# Patient Record
Sex: Female | Born: 2000 | Hispanic: Yes | Marital: Single | Smoking: Never smoker
Health system: Southern US, Community
[De-identification: ages and names within clinical notes are randomized; demographics above are authoritative.]

## PROBLEM LIST (undated history)

## (undated) DIAGNOSIS — F99 Mental disorder, not otherwise specified: Secondary | ICD-10-CM

## (undated) HISTORY — DX: Mental disorder, not otherwise specified: F99

---

## 2019-05-27 ENCOUNTER — Ambulatory Visit: Payer: Self-pay | Attending: Internal Medicine

## 2019-05-27 DIAGNOSIS — Z23 Encounter for immunization: Secondary | ICD-10-CM

## 2019-05-27 NOTE — Progress Notes (Signed)
   Covid-19 Vaccination Clinic  Name:  Gina Munoz    MRN: [id number] DOB: 12/20/2000  05/27/2019  Ms. Gina Munoz was observed post Covid-19 immunization for 15 minutes without incident. She was provided with Vaccine Information Sheet and instruction to access the V-Safe system. Medical Interpreter used.  Ms. Gina Munoz was instructed to call 911 with any severe reactions post vaccine: Marland Kitchen Difficulty breathing  . Swelling of face and throat  . A fast heartbeat  . A bad rash all over body  . Dizziness and weakness   Immunizations Administered    Name Date Dose VIS Date Route   Pfizer COVID-19 Vaccine 05/27/2019  4:29 PM 0.3 mL 02/04/2019 Intramuscular   Manufacturer: [employer], [employer]   Lot: [id number]   NDC: [address]-1

## 2019-06-17 ENCOUNTER — Ambulatory Visit: Payer: Self-pay | Attending: Internal Medicine

## 2019-06-17 DIAGNOSIS — Z23 Encounter for immunization: Secondary | ICD-10-CM

## 2019-06-17 NOTE — Progress Notes (Signed)
   Covid-19 Vaccination Clinic  Name:  Nakiah Osgood    MRN: [id number] DOB: 02/09/2001  06/17/2019  Ms. Everlynn Sagun was observed post Covid-19 immunization for 15 minutes without incident. She was provided with Vaccine Information Sheet and instruction to access the V-Safe system.   Ms. Bryella Diviney was instructed to call 911 with any severe reactions post vaccine: Marland Kitchen Difficulty breathing  . Swelling of face and throat  . A fast heartbeat  . A bad rash all over body  . Dizziness and weakness   Immunizations Administered    Name Date Dose VIS Date Route   Pfizer COVID-19 Vaccine 06/17/2019  3:54 PM 0.3 mL 04/20/2018 Intramuscular   Manufacturer: [employer], [employer]   Lot: [id number]   NDC: [address]-7

## 2020-10-13 ENCOUNTER — Encounter: Payer: Self-pay | Admitting: Emergency Medicine

## 2020-10-13 ENCOUNTER — Inpatient Hospital Stay
Admission: RE | Admit: 2020-10-13 | Discharge: 2020-10-19 | DRG: 885 | Disposition: A | Discharge status: Home / Self Care | Payer: 59 | Source: Intra-hospital | Attending: Psychiatry | Admitting: Psychiatry

## 2020-10-13 ENCOUNTER — Other Ambulatory Visit: Payer: Self-pay

## 2020-10-13 ENCOUNTER — Emergency Department
Admission: EM | Admit: 2020-10-13 | Discharge: 2020-10-13 | Disposition: A | Discharge status: Home Health | Payer: Self-pay | Attending: Emergency Medicine | Admitting: Emergency Medicine

## 2020-10-13 ENCOUNTER — Encounter: Payer: Self-pay | Admitting: Psychiatry

## 2020-10-13 DIAGNOSIS — F060 Psychotic disorder with hallucinations due to known physiological condition: Secondary | ICD-10-CM | POA: Insufficient documentation

## 2020-10-13 DIAGNOSIS — G4700 Insomnia, unspecified: Secondary | ICD-10-CM | POA: Diagnosis present

## 2020-10-13 DIAGNOSIS — R443 Hallucinations, unspecified: Secondary | ICD-10-CM | POA: Diagnosis present

## 2020-10-13 DIAGNOSIS — F419 Anxiety disorder, unspecified: Secondary | ICD-10-CM | POA: Diagnosis present

## 2020-10-13 DIAGNOSIS — Z818 Family history of other mental and behavioral disorders: Secondary | ICD-10-CM

## 2020-10-13 DIAGNOSIS — Y900 Blood alcohol level of less than 20 mg/100 ml: Secondary | ICD-10-CM | POA: Insufficient documentation

## 2020-10-13 DIAGNOSIS — F29 Unspecified psychosis not due to a substance or known physiological condition: Secondary | ICD-10-CM | POA: Diagnosis present

## 2020-10-13 DIAGNOSIS — Z20822 Contact with and (suspected) exposure to covid-19: Secondary | ICD-10-CM | POA: Diagnosis present

## 2020-10-13 DIAGNOSIS — F32A Depression, unspecified: Secondary | ICD-10-CM | POA: Diagnosis present

## 2020-10-13 DIAGNOSIS — F23 Brief psychotic disorder: Principal | ICD-10-CM | POA: Diagnosis present

## 2020-10-13 DIAGNOSIS — N946 Dysmenorrhea, unspecified: Secondary | ICD-10-CM | POA: Diagnosis not present

## 2020-10-13 LAB — CBC
HCT: 38 % (ref 36.0–46.0)
Hemoglobin: 13 g/dL (ref 12.0–15.0)
MCH: 29 pg (ref 26.0–34.0)
MCHC: 34.2 g/dL (ref 30.0–36.0)
MCV: 84.8 fL (ref 80.0–100.0)
Platelets: 301 10*3/uL (ref 150–400)
RBC: 4.48 MIL/uL (ref 3.87–5.11)
RDW: 12.2 % (ref 11.5–15.5)
WBC: 9.6 10*3/uL (ref 4.0–10.5)
nRBC: 0 % (ref 0.0–0.2)

## 2020-10-13 LAB — COMPREHENSIVE METABOLIC PANEL
ALT: 16 U/L (ref 0–44)
AST: 16 U/L (ref 15–41)
Albumin: 4.5 g/dL (ref 3.5–5.0)
Alkaline Phosphatase: 64 U/L (ref 38–126)
Anion gap: 7 (ref 5–15)
BUN: 10 mg/dL (ref 6–20)
CO2: 23 mmol/L (ref 22–32)
Calcium: 8.8 mg/dL — ABNORMAL LOW (ref 8.9–10.3)
Chloride: 105 mmol/L (ref 98–111)
Creatinine, Ser: 0.59 mg/dL (ref 0.44–1.00)
GFR, Estimated: >60 mL/min (ref >60)
Glucose, Bld: 109 mg/dL — ABNORMAL HIGH (ref 70–99)
Potassium: 3.8 mmol/L (ref 3.5–5.1)
Sodium: 135 mmol/L (ref 135–145)
Total Bilirubin: 0.8 mg/dL (ref 0.3–1.2)
Total Protein: 7.8 g/dL (ref 6.5–8.1)

## 2020-10-13 LAB — RESP PANEL BY RT-PCR (FLU A&B, COVID) ARPGX2
Influenza A by PCR: NEGATIVE
Influenza B by PCR: NEGATIVE
SARS Coronavirus 2 by RT PCR: NEGATIVE

## 2020-10-13 LAB — SALICYLATE LEVEL: Salicylate Lvl: <7 mg/dL — ABNORMAL LOW (ref 7.0–30.0)

## 2020-10-13 LAB — ETHANOL: Alcohol, Ethyl (B): <10 mg/dL (ref <10)

## 2020-10-13 LAB — ACETAMINOPHEN LEVEL: Acetaminophen (Tylenol), Serum: <10 ug/mL — ABNORMAL LOW (ref 10–30)

## 2020-10-13 MED ORDER — RISPERIDONE 1 MG PO TABS
0.5000 mg | ORAL_TABLET | Freq: Two times a day (BID) | ORAL | Status: DC
  Administered 2020-10-13: 0.5 mg via ORAL
  Filled 2020-10-13: qty 1

## 2020-10-13 MED ORDER — TRAZODONE HCL 50 MG PO TABS: 25.0000 mg | ORAL_TABLET | Freq: Every evening | ORAL | Status: DC | PRN

## 2020-10-13 MED ORDER — LORAZEPAM 1 MG PO TABS
1.0000 mg | ORAL_TABLET | Freq: Once | ORAL | Status: AC
  Administered 2020-10-13: 1 mg via ORAL
  Filled 2020-10-13: qty 1

## 2020-10-13 MED ORDER — ALUM & MAG HYDROXIDE-SIMETH 200-200-20 MG/5ML PO SUSP
30.0000 mL | ORAL | Status: DC | PRN
Start: 2020-10-13 — End: 2020-10-19

## 2020-10-13 MED ORDER — MAGNESIUM HYDROXIDE 400 MG/5ML PO SUSP
30.0000 mL | Freq: Every day | ORAL | Status: DC | PRN
Start: 2020-10-13 — End: 2020-10-19

## 2020-10-13 MED ORDER — ACETAMINOPHEN 325 MG PO TABS
650.0000 mg | ORAL_TABLET | Freq: Four times a day (QID) | ORAL | Status: DC | PRN
  Administered 2020-10-15 – 2020-10-16 (×2): 650 mg via ORAL
  Filled 2020-10-13 (×2): qty 2

## 2020-10-13 MED ORDER — TRAZODONE HCL 50 MG PO TABS
25.0000 mg | ORAL_TABLET | Freq: Every evening | ORAL | Status: DC | PRN
  Administered 2020-10-13: 25 mg via ORAL
  Filled 2020-10-13: qty 1

## 2020-10-13 MED ORDER — RISPERIDONE 1 MG PO TABS
0.5000 mg | ORAL_TABLET | Freq: Two times a day (BID) | ORAL | Status: DC
  Administered 2020-10-13 – 2020-10-15 (×4): 0.5 mg via ORAL
  Filled 2020-10-13 (×4): qty 1

## 2020-10-13 NOTE — ED Notes (Signed)
PATIENT PRESENTS WITH THE FOLLOWING ITEMS  JEWELRY SENT HOME WITH BF  1 PINK AND GREY JACKET 1 PAIR OF BLACK NIKE SHOES 1 PAIR OF GREY SOCKS BLACK LEGGINGS BLACK T-SHIRT

## 2020-10-13 NOTE — ED Notes (Signed)
Father at bedside for visit. Supervised by security.

## 2020-10-13 NOTE — ED Notes (Signed)
All patient clothing and shoes placed in belongings bag, labeled, and placed at the nursing station to be locked up in BHU. Patient provided burgundy scrubs, mesh underwear, and non-skid socks.

## 2020-10-13 NOTE — Tx Team (Signed)
Initial Treatment Plan 10/13/2020 4:34 PM [name] KDT:[id number]   PATIENT STRESSORS: Educational concerns Occupational concerns   PATIENT STRENGTHS: Physical Health Supportive family/friends   PATIENT IDENTIFIED PROBLEMS: Pressure from school and work                     DISCHARGE CRITERIA:  Ability to meet basic life and health needs Improved stabilization in mood, thinking, and/or behavior Medical problems require only outpatient monitoring  PRELIMINARY DISCHARGE PLAN: Return to previous living arrangement Return to previous work or school arrangements  PATIENT/FAMILY INVOLVEMENT: This treatment plan has been presented to and reviewed with the patient, Gina Munoz.  The patient has been given the opportunity to ask questions and make suggestions.  Celene Kras, RN 10/13/2020, 4:34 PM

## 2020-10-13 NOTE — Progress Notes (Signed)
Patient is very anxious on assessment. She appears internally preoccupied and is easily distractible. Patient is cautious and continuously repeats that she just wants to go home throughout the assessment. Patient denies suicidal and homicidal ideations. When asked about auditory and visual hallucinations, patient states, "not anymore". She is very vague about describing this and turns back on her words and states that she didn't really hear voices, she was just not sleeping for a few days due to drinking energy drinks throughout the day. She feels the sleep is her only issue and states that she does not need to be here. 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 She repeats that she is stressed and she is under a lot of pressure due to "a lot of things that have happened". Patient does not describe what has happened and states that there has not been any change in her routine for the past few weeks. Patient denies using any substances. Patient admission assessment needed to be cut short because patient was very tearful and preoccupied with speaking to her family. Patient was shown her room and oriented to the unit. Unit rules were explained. Dinner tray was ordered. Patient is seen talking on the phone with her father.   Skin assessment was done with Ellwood Handler, RN. No contraband found. Patient remains safe on the unit at this time.

## 2020-10-13 NOTE — ED Notes (Signed)
MD back at the bedside speaking with pt's father via interpreter service

## 2020-10-13 NOTE — ED Notes (Signed)
Father remains at the bedside

## 2020-10-13 NOTE — ED Provider Notes (Signed)
[facility] Emergency Department Provider Note  ____________________________________________   Event Date/Time   First MD Initiated Contact with Patient 10/13/20 [phone]     (approximate)  I have reviewed the triage vital signs and the nursing notes.   HISTORY  Chief Complaint Psychiatric Evaluation    HPI Gina Munoz is a 20 y.o. female with no significant past medical history who presents to the emergency department with her father and boyfriend for concerns for her not sleeping for the past 3 days.  Boyfriend is currently at the bedside he states that her father is currently in the parking lot.  He states that the patient lives with her parents.  The patient tells me that she has not been sleeping because she hears "a whole lot of noises around".  She tells me that she is hearing cars.  When asking her if she is hearing voices she tells me yes.  She is unable to tell me what these voices are saying but denies that they are telling her to harm herself or anyone else.  She is very difficult to get a history from as she appears to be responding to internal stimuli.  She denies psychiatric history.  Denies psychiatric hospitalization.  Denies taking any medications regularly.  Denies drug or alcohol use.  Denies pain, fever, cough, vomiting, diarrhea.  No family history of psychiatric illness.    Father - [phone])      History reviewed. No pertinent past medical history.  There are no problems to display for this patient.   History reviewed. No pertinent surgical history.  Prior to Admission medications   Not on File    Allergies Patient has no allergy information on record.  No family history on file.  Social History Social History   Tobacco Use   Smoking status: Never    Passive exposure: Never   Smokeless tobacco: Never  Vaping Use   Vaping Use: Every day  Substance Use Topics   Alcohol use: Not Currently   Drug use: Not  Currently    Review of Systems Level 5 caveat secondary to psychosis  ____________________________________________   PHYSICAL EXAM:  VITAL SIGNS: ED Triage Vitals  Enc Vitals Group     BP 10/13/20 0349 (!) 141/92     Pulse Rate 10/13/20 0349 87     Resp 10/13/20 0349 20     Temp 10/13/20 0349 98.9 F (37.2 C)     Temp Source 10/13/20 0349 Oral     SpO2 10/13/20 0349 100 %     Weight 10/13/20 0354 115 lb (52.2 kg)     Height 10/13/20 0354 5' (1.524 m)     Head Circumference --      Peak Flow --      Pain Score 10/13/20 0353 0     Pain Loc --      Pain Edu? --      Excl. in GC? --    CONSTITUTIONAL: Alert and oriented and responds appropriately to questions.  Appears very anxious HEAD: Normocephalic EYES: Conjunctivae clear, pupils appear equal, EOM appear intact ENT: normal nose; moist mucous membranes NECK: Supple, normal ROM CARD: RRR; S1 and S2 appreciated; no murmurs, no clicks, no rubs, no gallops RESP: Normal chest excursion without splinting or tachypnea; breath sounds clear and equal bilaterally; no wheezes, no rhonchi, no rales, no hypoxia or respiratory distress, speaking full sentences ABD/GI: Normal bowel sounds; non-distended; soft, non-tender, no rebound, no guarding, no peritoneal signs, no  hepatosplenomegaly BACK: The back appears normal EXT: Normal ROM in all joints; no deformity noted, no edema; no cyanosis SKIN: Normal color for age and race; warm; no rash on exposed skin NEURO: Moves all extremities equally PSYCH: Appears anxious and appears to be responding to internal stimuli.  Slow to answer questions.  She denies SI, HI.  Denies command hallucinations.  ____________________________________________   LABS (all labs ordered are listed, but only abnormal results are displayed)  Labs Reviewed  RESP PANEL BY RT-PCR (FLU A&B, COVID) [id number]  COMPREHENSIVE METABOLIC PANEL  ETHANOL  CBC  URINE DRUG SCREEN, QUALITATIVE ([facility] ONLY)  ACETAMINOPHEN  LEVEL  SALICYLATE LEVEL  POC URINE PREG, ED   ____________________________________________  EKG   ____________________________________________  RADIOLOGY I, [name], personally viewed and evaluated these images (plain radiographs) as part of my medical decision making, as well as reviewing the written report by the radiologist.  ED MD interpretation:    Official radiology report(s): No results found.  ____________________________________________   PROCEDURES  Procedure(s) performed (including Critical Care):  Procedures  ____________________________________________   INITIAL IMPRESSION / ASSESSMENT AND PLAN / ED COURSE  As part of my medical decision making, I reviewed the following data within the electronic MEDICAL RECORD NUMBER History obtained from family, Nursing notes reviewed and incorporated, Interpreter needed, Labs reviewed , Old chart reviewed, A consult was requested and obtained from this/these consultant(s) Psychiatry, Notes from prior ED visits, and [place] Controlled Substance Database         Patient here for concerns for psychosis and potentially her first psychotic break.  Differential also includes intoxication, psychosis from drug use.  She appears to be responding to internal stimuli and reports that she has been hearing voices but is not able to tell me what they are saying to her.  She denies any SI or HI.  She appears very anxious here and boyfriend reports she has not been sleeping in 3 days.  We will obtain screening labs, urine.  We will get collateral information from her father who speaks mostly Spanish.  We will place her under full IVC at this time and consult TTS and psychiatry.  ED PROGRESS  Father now at bedside.  Using the Spanish interpreter he reports that she has not slept in 3 days and has been acting bizarrely and has had nonsensical speech.  He states that she for example has told them that she was falling repeatedly when she was  standing and not falling down.  He states she has been talking about angels.  He is not aware of her voicing any thoughts of SI, HI or hallucinations.  She admits again to auditory hallucinations but states she cannot tell me what they are saying.  Have discussed with father that I feel she needs psychiatric evaluation and he agrees.  We will give Ativan here to help with insomnia.  7:43 AM  Patient screening labs are unremarkable.  Drug screen pending.  Awaiting psychiatric evaluation for further disposition.  She is under full IVC at this time.   I reviewed all nursing notes and pertinent previous records as available.  I have reviewed and interpreted any EKGs, lab and urine results, imaging (as available).  ____________________________________________   FINAL CLINICAL IMPRESSION(S) / ED DIAGNOSES  Final diagnoses:  Hallucinations  Insomnia, unspecified type     ED Discharge Orders     None       *Please note:  Navia Lindahl was evaluated in Emergency Department on 10/13/2020 for  the symptoms described in the history of present illness. She was evaluated in the context of the global COVID-19 pandemic, which necessitated consideration that the patient might be at risk for infection with the SARS-CoV-2 virus that causes COVID-19. Institutional protocols and algorithms that pertain to the evaluation of patients at risk for COVID-19 are in a state of rapid change based on information released by regulatory bodies including the CDC and federal and state organizations. These policies and algorithms were followed during the patient's care in the ED.  Some ED evaluations and interventions may be delayed as a result of limited staffing during and the pandemic.*   Note:  This document was prepared using Dragon voice recognition software and may include unintentional dictation errors.    Odell Choung, Layla Maw, DO 10/13/20 [phone]

## 2020-10-13 NOTE — ED Notes (Signed)
IVC/pending psych consult 

## 2020-10-13 NOTE — ED Notes (Signed)
MD at the bedside  

## 2020-10-13 NOTE — ED Notes (Signed)
Pt father at bedside at this time. 

## 2020-10-13 NOTE — ED Notes (Signed)
Upon this RN coming onto shift father was at bedside. This RN handed father a print out of visitation rules and phone call hours. Explained to father policy about visiting/talking to pt on the phone. Answered fathers questions and made sure his phone number is on file. Escorted father to lobby. Pt sleeping.

## 2020-10-13 NOTE — Progress Notes (Signed)
Patient more social interacting in the dayroom with others.  She has complaint of not being able to sleep.  She denies si/hi/avh depression.  She does endorse anxiety and rates her level 8/10. She keeps repeating  the same phrase over and over, that "a lot has happened" but she is never able to elaborate any further. Will continue to encourage confinement as she appears to have the need to discuss event(s) that has happened in her life.  Will continue to monitor with Q 15 minute checks.  Cleo Butler-Nicholson, LPN

## 2020-10-13 NOTE — ED Notes (Signed)
Pysch NP and TTS at bedside

## 2020-10-13 NOTE — Consult Note (Signed)
[facility] Face-to-Face Psychiatry Consult   Reason for Consult:  psychosis  Referring Physician:  EDP Patient Identification: Gina Munoz MRN:  [id number] Principal Diagnosis: Psychotic disorder with hallucinations (HCC) Diagnosis:  Principal Problem:   Psychotic disorder with hallucinations (HCC)   Total Time spent with patient: 1 hour  Subjective:   Gina Munoz is a 20 y.o. female patient admitted with psychosis.  HPI:  20 yo female with psychosis.  It started when her family returned from the beach, a couple weeks ago, and she was more distracted and repeating herself.  These behaviors increased to the point where she has not slept in three days.  She told her family she was hearing things.  On assessment, she is clearly responding to internal stimuli with some paucity of speech, very scared.  The team spoke to her father with an interpreter and is concerned about her behaviors.  Her brother in [place] had a similar episode awhile back.  No substance abuse, she does drink two energy drinks per day.  No recent or past traumas.  Denies suicidal/homicidal ideations.  She will transfer to [facility] for stabilization.  Past Psychiatric History: none  Risk to Self:  yes Risk to Others:  none Prior Inpatient Therapy:  none Prior Outpatient Therapy:  none  Past Medical History: History reviewed. No pertinent past medical history. History reviewed. No pertinent surgical history. Family History: No family history on file. Family Psychiatric  History: brother with similar episode in [place] Social History:  Social History   Substance and Sexual Activity  Alcohol Use Not Currently     Social History   Substance and Sexual Activity  Drug Use Not Currently    Social History   Socioeconomic History   Marital status: Single    Spouse name: Not on file   Number of children: Not on file   Years of education: Not on file   Highest education level: Not on file  Occupational  History   Not on file  Tobacco Use   Smoking status: Never    Passive exposure: Never   Smokeless tobacco: Never  Vaping Use   Vaping Use: Every day  Substance and Sexual Activity   Alcohol use: Not Currently   Drug use: Not Currently   Sexual activity: Yes  Other Topics Concern   Not on file  Social History Narrative   Not on file   Social Determinants of Health   Financial Resource Strain: Not on file  Food Insecurity: Not on file  Transportation Needs: Not on file  Physical Activity: Not on file  Stress: Not on file  Social Connections: Not on file   Additional Social History:    Allergies:  Not on File  Labs:  Results for orders placed or performed during the hospital encounter of 10/13/20 (from the past 48 hour(s))  Comprehensive metabolic panel     Status: Abnormal   Collection Time: 10/13/20  4:16 AM  Result Value Ref Range   Sodium 135 135 - 145 mmol/L   Potassium 3.8 3.5 - 5.1 mmol/L   Chloride 105 98 - 111 mmol/L   CO2 23 22 - 32 mmol/L   Glucose, Bld 109 (H) 70 - 99 mg/dL    Comment: Glucose reference range applies only to samples taken after fasting for at least 8 hours.   BUN 10 6 - 20 mg/dL   Creatinine, Ser 0.07 0.44 - 1.00 mg/dL   Calcium 8.8 (L) 8.9 - 10.3 mg/dL  Total Protein 7.8 6.5 - 8.1 g/dL   Albumin 4.5 3.5 - 5.0 g/dL   AST 16 15 - 41 U/L   ALT 16 0 - 44 U/L   Alkaline Phosphatase 64 38 - 126 U/L   Total Bilirubin 0.8 0.3 - 1.2 mg/dL   GFR, Estimated >16>60 >10>60 mL/min    Comment: (NOTE) Calculated using the CKD-EPI Creatinine Equation (2021)    Anion gap 7 5 - 15    Comment: Performed at [facility], [address] [place], [place] [address]  Ethanol     Status: None   Collection Time: 10/13/20  4:16 AM  Result Value Ref Range   Alcohol, Ethyl (B) <10 <10 mg/dL    Comment: (NOTE) Lowest detectable limit for serum alcohol is 10 mg/dL.  For medical purposes only. Performed at [facility], [address]  [place], [place] [address]   cbc     Status: None   Collection Time: 10/13/20  4:16 AM  Result Value Ref Range   WBC 9.6 4.0 - 10.5 K/uL   RBC 4.48 3.87 - 5.11 MIL/uL   Hemoglobin 13.0 12.0 - 15.0 g/dL   HCT 19.138.0 47.836.0 - 29.546.0 %   MCV 84.8 80.0 - 100.0 fL   MCH 29.0 26.0 - 34.0 pg   MCHC 34.2 30.0 - 36.0 g/dL   RDW 62.112.2 30.811.5 - 65.715.5 %   Platelets 301 150 - 400 K/uL   nRBC 0.0 0.0 - 0.2 %    Comment: Performed at [facility], [address] [place], [place] [address]  Acetaminophen level     Status: Abnormal   Collection Time: 10/13/20  5:01 AM  Result Value Ref Range   Acetaminophen (Tylenol), Serum <10 (L) 10 - 30 ug/mL    Comment: (NOTE) Therapeutic concentrations vary significantly. A range of 10-30 ug/mL  may be an effective concentration for many patients. However, some  are best treated at concentrations outside of this range. Acetaminophen concentrations >150 ug/mL at 4 hours after ingestion  and >50 ug/mL at 12 hours after ingestion are often associated with  toxic reactions.  Performed at [facility], [address] [place], [place] [address]   Salicylate level     Status: Abnormal   Collection Time: 10/13/20  5:01 AM  Result Value Ref Range   Salicylate Lvl <7.0 (L) 7.0 - 30.0 mg/dL    Comment: Performed at [facility], [address] [place], [place] [address]  Resp Panel by RT-PCR (Flu A&B, Covid) Nasopharyngeal Swab     Status: None   Collection Time: 10/13/20  5:01 AM   Specimen: Nasopharyngeal Swab; Nasopharyngeal(NP) swabs in vial transport medium  Result Value Ref Range   SARS Coronavirus 2 by RT PCR NEGATIVE NEGATIVE    Comment: (NOTE) SARS-CoV-2 target nucleic acids are NOT DETECTED.  The SARS-CoV-2 RNA is generally detectable in upper respiratory specimens during the acute phase of infection. The lowest concentration of SARS-CoV-2 viral copies this assay can detect is 138 copies/mL. A negative result does not preclude  SARS-Cov-2 infection and should not be used as the sole basis for treatment or other patient management decisions. A negative result may occur with  improper specimen collection/handling, submission of specimen other than nasopharyngeal swab, presence of viral mutation(s) within the areas targeted by this assay, and inadequate number of viral copies(<138 copies/mL). A negative result must be combined with clinical observations, patient history, and epidemiological information. The expected result is Negative.  Fact Sheet for Patients:  BloggerCourse.com  Fact Sheet for Healthcare Providers:  SeriousBroker.it  This test is no t yet approved or cleared by the [place] FDA and  has been authorized for detection and/or diagnosis of SARS-CoV-2 by FDA under an Emergency Use Authorization (EUA). This EUA will remain  in effect (meaning this test can be used) for the duration of the COVID-19 declaration under Section 564(b)(1) of the Act, 21 U.S.C.section 360bbb-3(b)(1), unless the authorization is terminated  or revoked sooner.       Influenza A by PCR NEGATIVE NEGATIVE   Influenza B by PCR NEGATIVE NEGATIVE    Comment: (NOTE) The Xpert Xpress SARS-CoV-2/FLU/RSV plus assay is intended as an aid in the diagnosis of influenza from Nasopharyngeal swab specimens and should not be used as a sole basis for treatment. Nasal washings and aspirates are unacceptable for Xpert Xpress SARS-CoV-2/FLU/RSV testing.  Fact Sheet for Patients: BloggerCourse.com  Fact Sheet for Healthcare Providers: SeriousBroker.it  This test is not yet approved or cleared by the [place] FDA and has been authorized for detection and/or diagnosis of SARS-CoV-2 by FDA under an Emergency Use Authorization (EUA). This EUA will remain in effect (meaning this test can be used) for the duration of the COVID-19  declaration under Section 564(b)(1) of the Act, 21 U.S.C. section 360bbb-3(b)(1), unless the authorization is terminated or revoked.  Performed at [facility], [address] [place], [place] [address]     Current Facility-Administered Medications  Medication Dose Route Frequency Provider Last Rate Last Admin   risperiDONE (RISPERDAL) tablet 0.5 mg  0.5 mg Oral BID Charm Rings, NP       No current outpatient medications on file.    Musculoskeletal: Strength & Muscle Tone: within normal limits Gait & Station: normal Patient leans: N/A  Psychiatric Specialty Exam: Physical Exam Vitals and nursing note reviewed.  Constitutional:      Appearance: Normal appearance.  HENT:     Head: Normocephalic.     Nose: Nose normal.  Pulmonary:     Effort: Pulmonary effort is normal.  Musculoskeletal:        General: Normal range of motion.     Cervical back: Normal range of motion.  Neurological:     General: No focal deficit present.     Mental Status: She is alert and oriented to person, place, and time.  Psychiatric:        Attention and Perception: She is inattentive. She perceives auditory hallucinations.        Mood and Affect: Mood is anxious.        Speech: Speech normal.        Behavior: Behavior normal. Behavior is cooperative.        Thought Content: Thought content is paranoid.        Cognition and Memory: Cognition is impaired.        Judgment: Judgment normal.    Review of Systems  Psychiatric/Behavioral:  Positive for hallucinations. The patient is nervous/anxious.   All other systems reviewed and are negative.  Blood pressure (!) 94/56, pulse 73, temperature 98.9 F (37.2 C), temperature source Oral, resp. rate 15, height 5' (1.524 m), weight 52.2 kg, SpO2 100 %.Body mass index is 22.46 kg/m.  General Appearance: Casual  Eye Contact:  Fair  Speech:  Normal Rate with some paucity  Volume:  Decreased  Mood:  Anxious  Affect:  Congruent  Thought  Process:  Coherent and Descriptions of Associations: Intact  Orientation:  Full (Time, Place, and Person)  Thought Content:  Hallucinations: Auditory  Suicidal Thoughts:  No  Homicidal Thoughts:  No  Memory:  Immediate;   Fair Recent;   Fair Remote;   Fair  Judgement:  Fair  Insight:  Fair  Psychomotor Activity:  Decreased  Concentration:  Concentration: Fair and Attention Span: Fair  Recall:  Fair  Fund of Knowledge:  Good  Language:  Good  Akathisia:  No  Handed:  Right  AIMS (if indicated):     Assets:  Housing Leisure Time Physical Health Resilience Social Support  ADL's:  Intact  Cognition:  WNL  Sleep:        Physical Exam: Physical Exam Vitals and nursing note reviewed.  Constitutional:      Appearance: Normal appearance.  HENT:     Head: Normocephalic.     Nose: Nose normal.  Pulmonary:     Effort: Pulmonary effort is normal.  Musculoskeletal:        General: Normal range of motion.     Cervical back: Normal range of motion.  Neurological:     General: No focal deficit present.     Mental Status: She is alert and oriented to person, place, and time.  Psychiatric:        Attention and Perception: She is inattentive. She perceives auditory hallucinations.        Mood and Affect: Mood is anxious.        Speech: Speech normal.        Behavior: Behavior normal. Behavior is cooperative.        Thought Content: Thought content is paranoid.        Cognition and Memory: Cognition is impaired.        Judgment: Judgment normal.   Review of Systems  Psychiatric/Behavioral:  Positive for hallucinations. The patient is nervous/anxious.   All other systems reviewed and are negative. Blood pressure (!) 94/56, pulse 73, temperature 98.9 F (37.2 C), temperature source Oral, resp. rate 15, height 5' (1.524 m), weight 52.2 kg, SpO2 100 %. Body mass index is 22.46 kg/m.  Treatment Plan Summary: Daily contact with patient to assess and evaluate symptoms and progress in  treatment, Medication management, and Plan : Psychotic disorder with hallucinations: -Started Risperdal 0.5 mg BId -Admit to inpatient psychiatric hospital for stabilization  Insomnia: -Started Trazodone 25 mg PRN, repeat once in an hour if not asleep  Disposition: Recommend psychiatric Inpatient admission when medically cleared.  Nanine Means, NP 10/13/2020 11:45 AM

## 2020-10-13 NOTE — ED Notes (Signed)
Pt informed of transport to BMU. Appears anxious.

## 2020-10-13 NOTE — Plan of Care (Signed)
  Problem: Activity: Goal: Interest or engagement in activities will improve Outcome: Not Progressing Goal: Sleeping patterns will improve Outcome: Not Progressing   Problem: Activity: Goal: Interest or engagement in activities will improve Outcome: Not Progressing Goal: Sleeping patterns will improve Outcome: Not Progressing   Problem: Coping: Goal: Ability to verbalize frustrations and anger appropriately will improve Outcome: Not Progressing Goal: Ability to demonstrate self-control will improve Outcome: Not Progressing   Problem: Health Behavior/Discharge Planning: Goal: Identification of resources available to assist in meeting health care needs will improve Outcome: Not Progressing Goal: Compliance with treatment plan for underlying cause of condition will improve Outcome: Not Progressing   Problem: Physical Regulation: Goal: Ability to maintain clinical measurements within normal limits will improve Outcome: Not Progressing   Problem: Safety: Goal: Periods of time without injury will increase Outcome: Not Progressing   Problem: Activity: Goal: Will verbalize the importance of balancing activity with adequate rest periods Outcome: Not Progressing   Problem: Education: Goal: Will be free of psychotic symptoms Outcome: Not Progressing Goal: Knowledge of the prescribed therapeutic regimen will improve Outcome: Not Progressing   Problem: Coping: Goal: Coping ability will improve Outcome: Not Progressing Goal: Will verbalize feelings Outcome: Not Progressing   Problem: Health Behavior/Discharge Planning: Goal: Compliance with prescribed medication regimen will improve Outcome: Not Progressing   Problem: Nutritional: Goal: Ability to achieve adequate nutritional intake will improve Outcome: Not Progressing   Problem: Role Relationship: Goal: Ability to communicate needs accurately will improve Outcome: Not Progressing Goal: Ability to interact with others  will improve Outcome: Not Progressing

## 2020-10-13 NOTE — ED Triage Notes (Signed)
Pt presents to ER from home accompanied by father and boyfriend, pt very anxious. Reports hears a lot of noise and voices, reports voices are not telling her to do anything, the voices are just there, reports she has not been able to sleep for the past 2 days, not eating. "I just want to have a family, I want to get married(points at her boyfriend)" Pt is calm in triage, pt is accompanied by her boyfriend. Pt denies SI/HI or visual hallucinations

## 2020-10-13 NOTE — BH Assessment (Signed)
Comprehensive Clinical Assessment (CCA) Note  10/13/2020 Gina Munoz [id number]   Gina Munoz Dionne Ano is a 20 year old female who presents to the ER due to recent changes in her behaviors, mood and mental state. Per the father, approximately two weeks ago, she started having trouble sleeping and "scared." It has increased and now she hasn't slept in two days. Prior to this, she wasn't having any of these symptoms. Per the report of the patient's father, the changes occurred when they were on vacation at the beach and have progressively worsened.  He also shared, her brother had the similar symptoms in the past and is now taking medications for it. He has no knowledge of her having any trauma or recent stressors or changes in her daily routines.She admits to hearing voices but when asked for details she stated she doesn't hear them anymore and changed the subject.  During the interview, the patient was calm, cooperative and pleasant. She attempted to answer questions but was fixated on seeing and speaking with her family.   Chief Complaint:  Chief Complaint  Patient presents with   Psychiatric Evaluation   Visit Diagnosis: Psychosis Disorder with hallucinations  CCA Screening, Triage and Referral (STR)  Patient Reported Information How did you hear about [place]? Family/Friend  What Is the Reason for Your Visit/Call Today? Family noticed a change in her behaviors approximately two weeks ago and it has worsened.  How Long Has This Been Causing You Problems? 1 wk - 1 month  What Do You Feel Would Help You the Most Today? Treatment for Depression or other mood problem   Have You Recently Had Any Thoughts About Hurting Yourself? No  Are You Planning to Commit Suicide/Harm Yourself At This time? No   Have you Recently Had Thoughts About Hurting Someone Karolee Ohs? No  Are You Planning to Harm Someone at This Time? No  Explanation: No data recorded  Have You Used Any Alcohol or Drugs  in the Past 24 Hours? No  How Long Ago Did You Use Drugs or Alcohol? No data recorded What Did You Use and How Much? No data recorded  Do You Currently Have a Therapist/Psychiatrist? No  Name of Therapist/Psychiatrist: No data recorded  Have You Been Recently Discharged From Any Office Practice or Programs? No  Explanation of Discharge From Practice/Program: No data recorded    CCA Screening Triage Referral Assessment Type of Contact: Face-to-Face  Telemedicine Service Delivery:   Is this Initial or Reassessment? No data recorded Date Telepsych consult ordered in CHL:  No data recorded Time Telepsych consult ordered in CHL:  No data recorded Location of Assessment: [facility] ED  Provider Location: [facility] [place] ED   Collateral Involvement: Father-Ricardo   Does Patient Have a Court Appointed Legal Guardian? No data recorded Name and Contact of Legal Guardian: No data recorded If Minor and Not Living with Parent(s), Who has Custody? No data recorded Is CPS involved or ever been involved? Never  Is APS involved or ever been involved? Never   Patient Determined To Be At Risk for Harm To Self or Others Based on Review of Patient Reported Information or Presenting Complaint? No  Method: No data recorded Availability of Means: No data recorded Intent: No data recorded Notification Required: No data recorded Additional Information for Danger to Others Potential: No data recorded Additional Comments for Danger to Others Potential: No data recorded Are There Guns or Other Weapons in Your Home? No data recorded Types of Guns/Weapons: No data recorded Are These Weapons  Safely Secured?                            No data recorded Who Could Verify You Are Able To Have These Secured: No data recorded Do You Have any Outstanding Charges, Pending Court Dates, Parole/Probation? No data recorded Contacted To Inform of Risk of Harm To Self or Others: No data recorded   Does Patient Present under  Involuntary Commitment? No  IVC Papers Initial File Date: No data recorded  [place] of Residence: [place]   Patient Currently Receiving the Following Services: Not Receiving Services   Determination of Need: Emergent (2 hours)   Options For Referral: Inpatient Hospitalization   CCA Biopsychosocial Patient Reported Schizophrenia/Schizoaffective Diagnosis in Past: No   Strengths: Family support, student and doing well, and able to communicate her needs.   Mental Health Symptoms Depression:   Difficulty Concentrating; Change in energy/activity   Duration of Depressive symptoms:  Duration of Depressive Symptoms: Greater than two weeks   Mania:   N/A   Anxiety:    Worrying; Restlessness; Difficulty concentrating   Psychosis:   Hallucinations   Duration of Psychotic symptoms:  Duration of Psychotic Symptoms: Less than six months   Trauma:   None   Obsessions:   None   Compulsions:   None   Inattention:   N/A   Hyperactivity/Impulsivity:   None   Oppositional/Defiant Behaviors:   None   Emotional Irregularity:   Frantic efforts to avoid abandonment   Other Mood/Personality Symptoms:  No data recorded   Mental Status Exam Appearance and self-care  Stature:   Small   Weight:   Average weight   Clothing:   Neat/clean; Age-appropriate   Grooming:   Normal   Cosmetic use:   None   Posture/gait:   Normal   Motor activity:   -- (Within normal range)   Sensorium  Attention:   Normal   Concentration:   Anxiety interferes; Scattered   Orientation:   Person; Place   Recall/memory:   Defective in Recent   Affect and Mood  Affect:   Anxious; Depressed   Mood:   Anxious   Relating  Eye contact:   Normal   Facial expression:   Anxious; Responsive; Sad   Attitude toward examiner:   Cooperative   Thought and Language  Speech flow:  Articulation error; Slow   Thought content:   Suspicious   Preoccupation:   Other  (Comment)   Hallucinations:   None   Organization:  No data recorded  [employer] of Knowledge:   Fair   Intelligence:   Average   Abstraction:   Functional   Judgement:   Impaired   Reality Testing:   Distorted   Insight:   Fair; Poor   Decision Making:   Paralyzed; Confused   Social Functioning  Social Maturity:   Isolates   Social Judgement:   Normal   Stress  Stressors:   Other (Comment)   Coping Ability:   Overwhelmed   Skill Deficits:   None   Supports:   Family; Friends/Service system     Religion: Religion/Spirituality Are You A Religious Person?: No  Leisure/Recreation: Leisure / Recreation Do You Have Hobbies?: No  Exercise/Diet: Exercise/Diet Do You Exercise?: No Have You Gained or Lost A Significant Amount of Weight in the Past Six Months?: No Do You Follow a Special Diet?: No Do You Have Any Trouble Sleeping?: Yes Explanation of Sleeping  Difficulties: Per family, she hasn't slept in two days. Prior to that, she hasn't slept well in two weeks   CCA Employment/Education Employment/Work Situation: Employment / Work Situation Employment Situation: Student Has Patient ever Been in Equities trader?: No  Education: Education Is Patient Currently Attending School?: Yes School Currently Attending: [employer] Last Grade Completed: 13 Did You Product manager?: Yes Did You Have An Individualized Education Program (IIEP): No Did You Have Any Difficulty At [employer]?: No Patient's Education Has Been Impacted by Current Illness: No   CCA Family/Childhood History Family and Relationship History: Family history Marital status: Long term relationship Does patient have children?: No  Childhood History:  Childhood History By whom was/is the patient raised?: Both parents Did patient suffer any verbal/emotional/physical/sexual abuse as a child?: No Did patient suffer from severe childhood neglect?: No Has  patient ever been sexually abused/assaulted/raped as an adolescent or adult?: No Was the patient ever a victim of a crime or a disaster?: No Witnessed domestic violence?: No Has patient been affected by domestic violence as an adult?: No  Child/Adolescent Assessment:     CCA Substance Use Alcohol/Drug Use: Alcohol / Drug Use Pain Medications: See PTA Prescriptions: See PTA Over the Counter: See PTA History of alcohol / drug use?: No history of alcohol / drug abuse Longest period of sobriety (when/how long): n/a  ASAM's:  Six Dimensions of Multidimensional Assessment  Dimension 1:  Acute Intoxication and/or Withdrawal Potential:      Dimension 2:  Biomedical Conditions and Complications:      Dimension 3:  Emotional, Behavioral, or Cognitive Conditions and Complications:     Dimension 4:  Readiness to Change:     Dimension 5:  Relapse, Continued use, or Continued Problem Potential:     Dimension 6:  Recovery/Living Environment:     ASAM Severity Score:    ASAM Recommended Level of Treatment:     Substance use Disorder (SUD)    Recommendations for Services/Supports/Treatments:    Discharge Disposition:    DSM5 Diagnoses: Patient Active Problem List   Diagnosis Date Noted   Psychotic disorder with hallucinations (HCC) 10/13/2020     Referrals to Alternative Service(s): Referred to Alternative Service(s):   Place:   Date:   Time:    Referred to Alternative Service(s):   Place:   Date:   Time:    Referred to Alternative Service(s):   Place:   Date:   Time:    Referred to Alternative Service(s):   Place:   Date:   Time:      Lilyan Gilford MS, LCAS, [facility], [facility] Therapeutic Triage Specialist 10/13/2020 1:09 PM

## 2020-10-13 NOTE — ED Notes (Signed)
Pt provided lunch tray.

## 2020-10-14 LAB — URINALYSIS, ROUTINE W REFLEX MICROSCOPIC
Bilirubin Urine: NEGATIVE
Glucose, UA: NEGATIVE mg/dL
Ketones, ur: NEGATIVE mg/dL
Leukocytes,Ua: NEGATIVE
Nitrite: NEGATIVE
Protein, ur: NEGATIVE mg/dL
Specific Gravity, Urine: 1.015 (ref 1.005–1.030)
pH: 7.5 (ref 5.0–8.0)

## 2020-10-14 LAB — PREGNANCY, URINE: Preg Test, Ur: NEGATIVE

## 2020-10-14 LAB — URINE DRUG SCREEN, QUALITATIVE (ARMC ONLY)
Amphetamines, Ur Screen: NOT DETECTED
Barbiturates, Ur Screen: NOT DETECTED
Benzodiazepine, Ur Scrn: NOT DETECTED
Cannabinoid 50 Ng, Ur ~~LOC~~: NOT DETECTED
Cocaine Metabolite,Ur ~~LOC~~: NOT DETECTED
MDMA (Ecstasy)Ur Screen: NOT DETECTED
Methadone Scn, Ur: NOT DETECTED
Opiate, Ur Screen: NOT DETECTED
Phencyclidine (PCP) Ur S: NOT DETECTED
Tricyclic, Ur Screen: NOT DETECTED

## 2020-10-14 MED ORDER — HYDROXYZINE HCL 50 MG PO TABS
50.0000 mg | ORAL_TABLET | Freq: Four times a day (QID) | ORAL | Status: DC | PRN
  Administered 2020-10-14 – 2020-10-18 (×7): 50 mg via ORAL
  Filled 2020-10-14 (×7): qty 1

## 2020-10-14 MED ORDER — TRAZODONE HCL 50 MG PO TABS
50.0000 mg | ORAL_TABLET | Freq: Every day | ORAL | Status: DC
  Administered 2020-10-14 – 2020-10-18 (×5): 50 mg via ORAL
  Filled 2020-10-14 (×5): qty 1

## 2020-10-14 NOTE — [facility] Suicide Risk Assessment (Addendum)
[facility] INPATIENT:  Family/Significant Other Suicide Prevention Education  Suicide Prevention Education:  Education Completed; Jackie Plum, father [phone]), contacted using Spanish interpreter services, has been identified by the patient as the family member/significant other with whom the patient will be residing, and identified as the person(s) who will aid the patient in the event of a mental health crisis (suicidal ideations/suicide attempt).  With written consent from the patient, the family member/significant other has been provided the following suicide prevention education, prior to the and/or following the discharge of the patient.  The suicide prevention education provided includes the following: Suicide risk factors Suicide prevention and interventions National Suicide Hotline telephone number [facility] assessment telephone number [facility] Emergency Assistance 911 [facility] and/or Residential Mobile Crisis Unit telephone number  Request made of family/significant other to: Remove weapons (e.g., guns, rifles, knives), all items previously/currently identified as safety concern.   Remove drugs/medications (over-the-counter, prescriptions, illicit drugs), all items previously/currently identified as a safety concern.  The family member/significant other verbalizes understanding of the suicide prevention education information provided.  The family member/significant other agrees to remove the items of safety concern listed above.  Patient's father reports there are no firearms or medication in the home that could be taken to assist in a suicide attempt.  Ileana Ladd Loi Rennaker 10/14/2020, 12:16 PM

## 2020-10-14 NOTE — [facility] Group Notes (Signed)
LCSW Group Therapy Note     10/14/2020 1:00PM     Type of Therapy and Topic:  Group Therapy: Coping Skills     Participation Level:  Did Not Attend       Description of Group: In this group, patients will learn about coping strategies. Patients will define what a coping strategy is and discuss how utilizing coping strategies can aid in emotional regulation and assist in the management of stress, anxiety, and depression symptoms. Patients will identify coping strategies that have helped them manage challenging emotions in the past and explore new coping strategies that they can begin to implement during times of emotional distress. Patients will learn the difference between positive and negative coping strategies and be encouraged to identify healthy replacements for unhelpful behaviors. Patients will be facilitated through the practice of a grounding technique to add to their coping strategy toolbox.         Therapeutic Goals:  1. Patient will be able to define what a coping strategy is and understand the importance of using coping skills for emotional well-being.  2. Patient will identify three coping strategies that they can practice when experiencing feelings of stress, anxiety, anger, and/or depression.  3. Patient will identify one unhelpful coping strategy they have used prior to admission and identify a healthy replacement they can practice instead.  4. Patient will learn one new grounding technique they can practice after discharge to aid in emotional regulation.      Summary of Patient Progress: Patient did not attend group despite encouraged participation.         Therapeutic Modalities:  Cognitive Behavioral Therapy Relapse Prevention Therapy Solution-Focused Therapy    Norberto Sorenson, MSW, Bryon Lions 10/14/2020 4:58 PM

## 2020-10-14 NOTE — Plan of Care (Addendum)
Patient during assessments observed up walking in the hallway, appearing sad, tearful, and anxious. Pt. Engaged, given support and encouragement that she is safe on the unit, and staff is here to help. Pt. During our engagement endorsed her mood is stressed, frightened to be at the facility, anxious, and sad. She expressed difficulties being present on the unit and away from family. She denied si/hi/avh, endorsed an ability to remain safe on the unit, but appeared internally preoccupied, though unclear if RTIS or cognitive slowing. She denied physical pain and endorsed toleration thus far of medicines started for treatment. Her orientation was intact. She denied physical pain and endorsed her appetite is "starving". She endorsed she has been sleeping poorly. Pt. Overall thought process circumstantial to disorganized. She presented oddly related and with an atypical interpersonal style and appreciable sadness and anxiousness.  Patient has been complaint with medications and unit procedures thus far with direction and encouragement. Pt. Has been observed eating good thus far, observed up in the milieu for meals with peers. Pt. Has been able to remain safe on the unit thus far. Pt. Outside of meals has been observed on the unit phone for several hours and or observed with intermittent tearfulness. Pt. Given PRN medicines for comfort and safety.   Q x 15 minute observation checks in place/maintained for safety. Patient is provided with education throughout shift when appropriate and able.  Patient is given/offered medications per orders. Patient is encouraged to attend groups, participate in unit activities and continue with plan of care. Pt. Chart and plans of care reviewed. Pt. Given support and encouragement when appropriate and able.      Problem: Education: Goal: Knowledge of [facility] Education information/materials will improve Outcome: Progressing Note: Pt. Is superficially receptive to  information provided to her.   Goal: Emotional status will improve Outcome: Not Progressing Note: Pt. Presents tearful, anxious, and sad.   Goal: Mental status will improve Outcome: Not Progressing Note: Pt. Presents cognitively slowed with circumstantial to disorganized thought process and concrete thinking. Pt. Presents appearing to be internally preoccupied, though it is unclear if RTIS.    Problem: Health Behavior/Discharge Planning: Goal: Compliance with treatment plan for underlying cause of condition will improve Outcome: Progressing Note: Pt. Has been able to be complaint with unit procedures and medications.    Problem: Safety: Goal: Periods of time without injury will increase Outcome: Progressing Note: Pt. Has been able to remain safe on the unit thus far. Pt. Denied si/hi/avh and endorsed an ability to remain safe on the unit.    Problem: Education: Goal: Will be free of psychotic symptoms Outcome: Not Progressing Note: Pt. Presents cognitively slowed, disorganized in her thought process, and internally preoccupied.

## 2020-10-14 NOTE — H&P (Addendum)
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  I was able to speak with patient's father via the Spanish interpreting System this morning. Her father indicates that they vacationed at [employer] on Fourth of [place] for a week. Afterwards, they noticed that Gina Munoz was more distracted and repeated herself but was fine for the most part. She then stopped sleeping altogether in the past 1 week., attributed to stress, but the family is not aware of what she is going through. She told her mother that she had been hearing noises but they uncertain of the frequency, duration, or severity. Patient agrees to stress--what she was working two jobs, day and night and was burnt out [employer], work, and school. Patient states that she is no longer doing this.  During evaluation patient is disorganized, She pauses frequently. When I asked her why she's here, she begins by telling me "we're all part of the community." Later indicates that she does not know why. Repeats several times when she was caught drinking which resulted in a DUI at the age of 21. Denies suicidal or homicidal thoughts. Says that she's looking forward to the future. Agrees that she has not been sleeping. Notes eating well. When asked if she has been hearing "noises" which was mentioned in the ER, [pt states that it's "just  anxiety, nothing else." Patient denies delusions. Patient was started on Risperdal 0.5 mg twice daily and emergency apartment as well as  PRN trazodone 25 mg PRN at night.  Patient denies recent use of drugs and alcohol. But her father indicated that she has a half brother who had a depressive episode at the age of 29. Patient denies I've been depressed recently. No family hx of psychosis or mania. Pt is not able to illicit symptoms of mania at this time.   Prior Inpatient Therapy:   Prior Outpatient Therapy:    Alcohol Screening: 1. How often do you have a drink containing alcohol?: Monthly or less 2. How many drinks containing alcohol do you have on a typical day when you are drinking?: 1 or 2 3. How often do you have six or more drinks on one occasion?: Never AUDIT-C Score: 1 4. How often during the last year have you found that you were not able to stop drinking once you had started?: Never 5. How often during the last year have you failed to do what was normally expected from you because of drinking?: Never 6. How often during the last year have you needed a first drink in the morning to get yourself going after a heavy drinking session?: Never 7. How often during the last year have you had a feeling of guilt of remorse after drinking?: Never 8. How often during the last year have you been unable to remember what happened the night before because you had been drinking?: Never 9. Have you or someone else been injured as a result of your drinking?: No 10. Has a relative or friend or a doctor or another health worker been concerned  about your drinking or suggested you cut down?: No Alcohol Use Disorder Identification Test Final Score (AUDIT): 1  Past Medical History: History reviewed. No pertinent past medical history. History reviewed. No pertinent surgical history. Family History: History reviewed. No pertinent family history. Family Psychiatric  History: brother had depression.  Tobacco Screening:   Social History:  Social History   Substance and Sexual Activity  Alcohol Use Not Currently     Social History    Substance and Sexual Activity  Drug Use Not Currently    Additional Social History:                           Allergies:  No Known Allergies Lab Results:  Results for orders placed or performed during the hospital encounter of 10/13/20 (from the past 48 hour(s))  Comprehensive metabolic panel     Status: Abnormal   Collection Time: 10/13/20  4:16 AM  Result Value Ref Range   Sodium 135 135 - 145 mmol/L   Potassium 3.8 3.5 - 5.1 mmol/L   Chloride 105 98 - 111 mmol/L   CO2 23 22 - 32 mmol/L   Glucose, Bld 109 (H) 70 - 99 mg/dL    Comment: Glucose reference range applies only to samples taken after fasting for at least 8 hours.   BUN 10 6 - 20 mg/dL   Creatinine, Ser 3.71 0.44 - 1.00 mg/dL   Calcium 8.8 (L) 8.9 - 10.3 mg/dL   Total Protein 7.8 6.5 - 8.1 g/dL   Albumin 4.5 3.5 - 5.0 g/dL   AST 16 15 - 41 U/L   ALT 16 0 - 44 U/L   Alkaline Phosphatase 64 38 - 126 U/L   Total Bilirubin 0.8 0.3 - 1.2 mg/dL   GFR, Estimated >06 >26 mL/min    Comment: (NOTE) Calculated using the CKD-EPI Creatinine Equation (2021)    Anion gap 7 5 - 15    Comment: Performed at [facility], [address] [place], [place] [address]  Ethanol     Status: None   Collection Time: 10/13/20  4:16 AM  Result Value Ref Range   Alcohol, Ethyl (B) <10 <10 mg/dL    Comment: (NOTE) Lowest detectable limit for serum alcohol is 10 mg/dL.  For medical purposes only. Performed at [facility], [address] [place], [place] [address]   cbc     Status: None   Collection Time: 10/13/20  4:16 AM  Result Value Ref Range   WBC 9.6 4.0 - 10.5 K/uL   RBC 4.48 3.87 - 5.11 MIL/uL   Hemoglobin 13.0 12.0 - 15.0 g/dL   HCT 50.0 93.8 - 18.2 %   MCV 84.8 80.0 - 100.0 fL   MCH 29.0 26.0 - 34.0 pg   MCHC 34.2 30.0 - 36.0 g/dL   RDW 99.3 71.6 - 96.7 %   Platelets 301 150 - 400 K/uL   nRBC 0.0 0.0 - 0.2 %    Comment: Performed at [facility], [address]  [place], [place] [address]  Acetaminophen level     Status: Abnormal   Collection Time: 10/13/20  5:01 AM  Result Value Ref Range   Acetaminophen (Tylenol), Serum <10 (L) 10 - 30 ug/mL    Comment: (NOTE) Therapeutic concentrations vary significantly. A range of 10-30 ug/mL  may be an effective concentration for many patients. However, some  are best treated at concentrations outside of this range. Acetaminophen concentrations >  150 ug/mL at 4 hours after ingestion  and >50 ug/mL at 12 hours after ingestion are often associated with  toxic reactions.  Performed at [facility], [address] [place], [place] [address]   Salicylate level     Status: Abnormal   Collection Time: 10/13/20  5:01 AM  Result Value Ref Range   Salicylate Lvl <7.0 (L) 7.0 - 30.0 mg/dL    Comment: Performed at [facility], [address], [place], [place] [address]  Resp Panel by RT-PCR (Flu A&B, Covid) Nasopharyngeal Swab     Status: None   Collection Time: 10/13/20  5:01 AM   Specimen: Nasopharyngeal Swab; Nasopharyngeal(NP) swabs in vial transport medium  Result Value Ref Range   SARS Coronavirus 2 by RT PCR NEGATIVE NEGATIVE    Comment: (NOTE) SARS-CoV-2 target nucleic acids are NOT DETECTED.  The SARS-CoV-2 RNA is generally detectable in upper respiratory specimens during the acute phase of infection. The lowest concentration of SARS-CoV-2 viral copies this assay can detect is 138 copies/mL. A negative result does not preclude SARS-Cov-2 infection and should not be used as the sole basis for treatment or other patient management decisions. A negative result may occur with  improper specimen collection/handling, submission of specimen other than nasopharyngeal swab, presence of viral mutation(s) within the areas targeted by this assay, and inadequate number of viral copies(<138 copies/mL). A negative result must be combined with clinical observations, patient history, and  epidemiological information. The expected result is Negative.  Fact Sheet for Patients:  BloggerCourse.com  Fact Sheet for Healthcare Providers:  SeriousBroker.it  This test is no t yet approved or cleared by the [place] FDA and  has been authorized for detection and/or diagnosis of SARS-CoV-2 by FDA under an Emergency Use Authorization (EUA). This EUA will remain  in effect (meaning this test can be used) for the duration of the COVID-19 declaration under Section 564(b)(1) of the Act, 21 U.S.C.section 360bbb-3(b)(1), unless the authorization is terminated  or revoked sooner.       Influenza A by PCR NEGATIVE NEGATIVE   Influenza B by PCR NEGATIVE NEGATIVE    Comment: (NOTE) The Xpert Xpress SARS-CoV-2/FLU/RSV plus assay is intended as an aid in the diagnosis of influenza from Nasopharyngeal swab specimens and should not be used as a sole basis for treatment. Nasal washings and aspirates are unacceptable for Xpert Xpress SARS-CoV-2/FLU/RSV testing.  Fact Sheet for Patients: BloggerCourse.com  Fact Sheet for Healthcare Providers: SeriousBroker.it  This test is not yet approved or cleared by the [place] FDA and has been authorized for detection and/or diagnosis of SARS-CoV-2 by FDA under an Emergency Use Authorization (EUA). This EUA will remain in effect (meaning this test can be used) for the duration of the COVID-19 declaration under Section 564(b)(1) of the Act, 21 U.S.C. section 360bbb-3(b)(1), unless the authorization is terminated or revoked.  Performed at [facility], [address] [place], [place] [address]     Blood Alcohol level:  Lab Results  Component Value Date   [facility] <10 10/13/2020    Metabolic Disorder Labs:  No results found for: HGBA1C, MPG No results found for: PROLACTIN No results found for: CHOL, TRIG, HDL, CHOLHDL, VLDL,  LDLCALC  Current Medications: Current Facility-Administered Medications  Medication Dose Route Frequency Provider Last Rate Last Admin   acetaminophen (TYLENOL) tablet 650 mg  650 mg Oral Q6H PRN Charm Rings, NP       alum & mag hydroxide-simeth (MAALOX/MYLANTA) 200-200-20 MG/5ML suspension  30 mL  30 mL Oral Q4H PRN Charm RingsLord, Jamison Y, NP       magnesium hydroxide (MILK OF MAGNESIA) suspension 30 mL  30 mL Oral Daily PRN Charm RingsLord, Jamison Y, NP       risperiDONE (RISPERDAL) tablet 0.5 mg  0.5 mg Oral BID Charm RingsLord, Jamison Y, NP   0.5 mg at 10/13/20 1845   traZODone (DESYREL) tablet 25 mg  25 mg Oral QHS PRN Charm RingsLord, Jamison Y, NP   25 mg at 10/13/20 2134   PTA Medications: No medications prior to admission.    Musculoskeletal: Strength & Muscle Tone: within normal limits Gait & Station: normal Patient leans: Right   Psychiatric Specialty Exam:  Presentation  General Appearance:  Stated age Eye Contact: fair Speech: interrupted Speech Volume: soft  Mood and Affect  Mood: Stressed Affect: Congruent Thought Process  Thought Processes: No data recorded Duration of Psychotic Symptoms: Less than six months  Past Diagnosis of Schizophrenia or Psychoactive disorder: No  Sensorium  Memory: No data recorded Judgment: poorI Insight: Poor  Psychomotor Activity  Psychomotor Activity: wnl     Physical Exam: Physical Exam ROS Blood pressure 130/86, pulse (!) 102, temperature 98.2 F (36.8 C), temperature source Oral, resp. rate 16, height 5' (1.524 m), weight 50.3 kg, SpO2 99 %. Body mass index is 21.68 kg/m.  Treatment Plan Summary: Daily contact with patient to assess and evaluate symptoms and progress in treatment  Observation Level/Precautions:  Continuous Observation    Psychotherapy:    Medications:    Consultations:    Discharge Concerns:    Estimated LOS:  Other:     Physician Treatment Plan for Primary Diagnosis: <principal problem not specified> Long  Term Goal(s): Improvement in symptoms so as ready for discharge  Short Term Goals: Ability to identify changes in lifestyle to reduce recurrence of condition will improve and Ability to verbalize feelings will improve  Physician Treatment Plan for Secondary Diagnosis: Active Problems:   Psychotic disorder with hallucinations (HCC)  Long Term Goal(s): Improvement in symptoms so as ready for discharge  Short Term Goals: Ability to verbalize feelings will improve  Plan: Spoke with pt's father via [employer] interpreter system Continue Risperdal 0.5mg  PO BID for ED. Risks of EPS, NMS, weight gain, metabolic disturbance reviewed.  Schedule Trazodone 50mg  PO q HS instead of PRN If limited benefit, consider Seroquel Establish and maintain a therapeutic alliance with the patient Obtain lipid panel and HbA1c tomorrow AM Obtain TSH Obtain Pregnancy test, not obtained in ED Obtain UDS and U/A  TIME: 75 minutes. Greater than 50% of time was spent counseling and coordination of care.   I certify that inpatient services furnished can reasonably be expected to improve the patient's condition.    Reggie PileAnand Burr Soffer, MD 8/21/20228:30 AM

## 2020-10-14 NOTE — [facility] Counselor (Signed)
Adult Comprehensive Assessment  Patient ID: Gina Munoz, female   DOB: Sep 25, 2000, 20 y.o.   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 Who else lives in the home?: Mother, father, 2 brothers How long has patient lived in current situation?: Entire life What is atmosphere in current home: Comfortable, Paramedic, Supportive  Family History:  Marital status: Other (comment) (In a relationship since July 2022) Are you sexually active?: Yes What is your sexual orientation?: Heterosexual Has your sexual activity been affected by drugs, alcohol, medication, or emotional stress?: "Emotional stress" Does patient have children?: No  Childhood History:  By whom was/is the patient raised?: Both parents Additional childhood history information: Patient describes her childhood as fun. Description of  patient's relationship with caregiver when they were a child: "Healthy" Patient's description of current relationship with people who raised him/her: "Loving" How were you disciplined when you got in trouble as a child/adolescent?: "Hit me but to learn" Patient states she does not feel like it was excessive Does patient have siblings?: Yes (2 brothers, twins, 66 years old) Number of Siblings: 2 Description of patient's current relationship with siblings: Patient states "it's good" Did patient suffer any verbal/emotional/physical/sexual abuse as a child?: No Did patient suffer from severe childhood neglect?: No Has patient ever been sexually abused/assaulted/raped as an adolescent or adult?: No Was the patient ever a victim of a crime or a disaster?: No Witnessed domestic violence?: No Has patient been affected by domestic violence as an adult?: No  Education:  Highest grade of school patient has completed: First semester at [employer] [place] [employer] Currently a student?: Yes Name of school: [employer] How long has the patient attended?: Administrator started 2nd semester this past Monday Learning disability?: No  Employment/Work Situation:   Employment Situation: Employed 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?: No  Financial Resources:   Surveyor, quantity resources: [employer], Income from employment Does patient have a Lawyer or guardian?: No  Alcohol/Substance Abuse:   What has  been your use of drugs/alcohol within  the last 12 months?: Patient states she last drank on her birthday (July 24th, 2024) Patient states she drinks alcohol (a few bers) on special occassions If attempted suicide, did drugs/alcohol play a role in this?:  (n/a) Alcohol/Substance Abuse Treatment Hx:  (DUI class in 2019) If yes, describe treatment: Outpatient classes for alcohol use after DUI charge in 2019 Has alcohol/substance abuse ever caused legal problems?: Yes (Patient got a DUI in 2019)  Social Support System:   Patient's Community Support System: Good Describe Community Support System: Patient states, "i want to be with my family and they are going to help me" Patient states she is friends with her coworkers and receives support from her boyfriend. Type of faith/religion: Patient states she prays How does patient's faith help to cope with current illness?: Patient states it helps her "give empathy to others, want to stay alive, and process things"  Leisure/Recreation:   Do You Have Hobbies?: Yes Leisure and Hobbies: Patient states she likes to go to the gym.  Strengths/Needs:   What is the patient's perception of their strengths?: "Giving empathy to those in need, I work hard for what I have accomplished, i'm humble, I ask for help when I need it" Patient states they can use these personal strengths during their treatment to contribute to their recovery: Patient agrees Patient states these barriers may affect/interfere with their treatment: Patient states "i'm fine, I am just experiencing this whole situation and I was just going through so many things. Nothing is going to stop me". Patient states these barriers may affect their return to the community: Patient did not identify any barriers. 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  Discharge Plan:   Currently receiving community mental health  services: No Patient states concerns and preferences for aftercare planning are: Patient states she is open to taking medication and would like to meet with a counselor. Patient states they will know when they are safe and ready for discharge when: "When my whole family is together" Does patient have access to transportation?: Yes (Patient's parents brought her to the hospital) Does patient have financial barriers related to discharge medications?: No Patient description of barriers related to discharge medications: Patient denies Will patient be returning to same living situation after discharge?: Yes  Summary/Recommendations:   Summary and Recommendations (to be completed by the evaluator): Patient is a 20 year old female from [place], [place] who presented to the ED at [facility] voluntarily. Per chart review, patient was accompanied to the ED by her father and boyfriend due to psychosis symptoms, marked by hearing noises and voices and an inability to sleep over the past few days. During assessment, patient denies auditory and visual hallucinations. Patient confirms she has had difficulty sleeping and reports recent feelings of depression and anxiety. Patient denies substance use. Patient reports that she is under a lot of stress, stating "I am experiencing a lot", but was only able to identify work as a current stressor. Patient is also in school at a community college; however, she reports she enjoys school and it is not a current stressor. Patient does not currently see any outpatient providers. Recommendations include: crisis stabilization, therapeutic milieu, encourage group attendance and participation, medication management for mood stabilization and development of comprehensive mental wellness plan.  Gina Munoz. 10/14/2020

## 2020-10-14 NOTE — [facility] Suicide Risk Assessment (Signed)
[facility] Admission Suicide Risk Assessment   Nursing information obtained from:  Patient Demographic factors:  NA Current Mental Status:  NA Loss Factors:  NA Historical Factors:  Family history of mental illness or substance abuse Risk Reduction Factors:  Employed, Living with another person, especially a relative, Positive social support  Total Time spent with patient: 1 hour Principal Problem: <principal problem not specified> Diagnosis:  Active Problems:   Psychotic disorder with hallucinations (HCC)  Subjective Data:  Patient is a 20 year old female with no known psychiatric history who was brought to the emergency program by her father and boyfriend due to bizarre behaviors for the past two weeks and a lack of sleep over the past week.   I was able to speak with patient's father via the Spanish interpreting System this morning. Her father indicates that they vacationed at [employer] on Fourth of [place] for a week. Afterwards, they noticed that [name] was more distracted and repeated herself but was fine for the most part. She then stopped sleeping altogether in the past 1 week., attributed to stress, but the family is not aware of what she is going through. She told her mother that she had been hearing noises but they uncertain of the frequency, duration, or severity. Patient agrees to stress--what she was working two jobs, day and night and was burnt out [employer], work, and school. Patient states that she is no longer doing this.   During evaluation patient is disorganized, She pauses frequently. When I asked her why she's here, she begins by telling me "we're all part of the community." Later indicates that she does not know why. Repeats several times when she was caught drinking which resulted in a DUI at the age of 10. Denies suicidal or homicidal thoughts. Says that she's looking forward to the future. Agrees that she has not been sleeping. Notes eating well. When asked if she has been  hearing "noises" which was mentioned in the ER, [pt states that it's "just  anxiety, nothing else." Patient denies delusions. Patient was started on Risperdal 0.5 mg twice daily and emergency apartment as well as PRN trazodone 25 mg PRN at night.   Patient denies recent use of drugs and alcohol. But her father indicated that she has a half brother who had a depressive episode at the age of 75. Patient denies I've been depressed recently. No family hx of psychosis or mania. Pt is not able to illicit symptoms of mania at this time.   Continued Clinical Symptoms:  Alcohol Use Disorder Identification Test Final Score (AUDIT): 1 The "Alcohol Use Disorders Identification Test", Guidelines for Use in Primary Care, Second Edition.  World Science writer [facility]). Score between 0-7:  no or low risk or alcohol related problems. Score between 8-15:  moderate risk of alcohol related problems. Score between 16-19:  high risk of alcohol related problems. Score 20 or above:  warrants further diagnostic evaluation for alcohol dependence and treatment.   CLINICAL FACTORS:   Schizophrenia:   Depressive state Musculoskeletal: Strength & Muscle Tone: within normal limits Gait & Station: normal Patient leans: Right     Psychiatric Specialty Exam:   Presentation  General Appearance:  Stated age Eye Contact: fair Speech: interrupted Speech Volume: soft   Mood and Affect  Mood: Stressed Affect: Congruent Thought Process  Thought Processes: No data recorded Duration of Psychotic Symptoms: Less than six months   Past Diagnosis of Schizophrenia or Psychoactive disorder: No   Sensorium  Memory:  No data recorded Judgment: poor Insight: Poor   Psychomotor Activity  Psychomotor Activity: wnl Physical Exam: Physical Exam ROS Blood pressure 130/86, pulse (!) 102, temperature 98.2 F (36.8 C), temperature source Oral, resp. rate 16, height 5' (1.524 m), weight 50.3 kg, SpO2 99 %. Body mass  index is 21.68 kg/m.  PLAN: Spoke with pt's father via [employer] interpreter system Continue Risperdal 0.5mg  PO BID for ED. Risks of EPS, NMS, weight gain, metabolic disturbance reviewed.  Schedule Trazodone 50mg  PO q HS instead of PRN If limited benefit, consider Seroquel Establish and maintain a therapeutic alliance with the patient Obtain lipid panel and HbA1c tomorrow AM Obtain TSH Obtain Pregnancy test, not obtained in ED Obtain UDS and U/A  COGNITIVE FEATURES THAT CONTRIBUTE TO RISK:  None    SUICIDE RISK:   Moderate:  Frequent suicidal ideation with limited intensity, and duration, some specificity in terms of plans, no associated intent, good self-control, limited dysphoria/symptomatology, some risk factors present, and identifiable protective factors, including available and accessible social support.  PLAN OF CARE:   I certify that inpatient services furnished can reasonably be expected to improve the patient's condition.   , MD 10/14/2020, 8:46 AM

## 2020-10-14 NOTE — Plan of Care (Signed)
  Problem: Education: Goal: Knowledge of [facility] Education information/materials will improve Outcome: Progressing Goal: Emotional status will improve Outcome: Progressing Goal: Mental status will improve Outcome: Progressing Goal: Verbalization of understanding the information provided will improve Outcome: Progressing   Problem: Activity: Goal: Interest or engagement in activities will improve Outcome: Progressing Goal: Sleeping patterns will improve Outcome: Progressing   Problem: Coping: Goal: Ability to verbalize frustrations and anger appropriately will improve Outcome: Progressing Goal: Ability to demonstrate self-control will improve Outcome: Progressing   

## 2020-10-15 LAB — LIPID PANEL
Cholesterol: 143 mg/dL (ref 0–200)
HDL: 48 mg/dL (ref >40)
LDL Cholesterol: 75 mg/dL (ref 0–99)
Total CHOL/HDL Ratio: 3 RATIO
Triglycerides: 100 mg/dL (ref <150)
VLDL: 20 mg/dL (ref 0–40)

## 2020-10-15 LAB — TSH: TSH: 2.364 u[IU]/mL (ref 0.350–4.500)

## 2020-10-15 LAB — HEMOGLOBIN A1C
Hgb A1c MFr Bld: 5.2 % (ref 4.8–5.6)
Mean Plasma Glucose: 102.54 mg/dL

## 2020-10-15 MED ORDER — CLONAZEPAM 0.5 MG PO TABS
0.5000 mg | ORAL_TABLET | Freq: Two times a day (BID) | ORAL | Status: DC | PRN
  Administered 2020-10-15: 0.5 mg via ORAL
  Filled 2020-10-15: qty 1

## 2020-10-15 MED ORDER — CLONAZEPAM 0.5 MG PO TABS
0.5000 mg | ORAL_TABLET | Freq: Three times a day (TID) | ORAL | Status: DC | PRN
  Administered 2020-10-15 – 2020-10-19 (×8): 0.5 mg via ORAL
  Filled 2020-10-15 (×8): qty 1

## 2020-10-15 MED ORDER — RISPERIDONE 1 MG PO TABS
1.0000 mg | ORAL_TABLET | Freq: Two times a day (BID) | ORAL | Status: DC
  Administered 2020-10-15 – 2020-10-17 (×4): 1 mg via ORAL
  Filled 2020-10-15 (×4): qty 1

## 2020-10-15 MED ORDER — TEMAZEPAM 7.5 MG PO CAPS: 7.5000 mg | ORAL_CAPSULE | Freq: Every evening | ORAL | Status: DC | PRN

## 2020-10-15 MED ORDER — TEMAZEPAM 15 MG PO CAPS: 15.0000 mg | ORAL_CAPSULE | Freq: Every evening | ORAL | Status: DC | PRN

## 2020-10-15 NOTE — Progress Notes (Signed)
Patient is pleasant and easy to engage in conversation. She presents very anxious and does in fact endorse her anxiety being at 10/10.  She has recently been on the phone with family which seems to be a stressor for her. She is med compliant and denies si/hi/avh and pain at this encounter. She states that her depression is related to being here in the hospital and she rates at 4/10. She reports not being able to sleep at all the night before and then goes further to say she has not been able to sleep for weeks now on getting a couple of hours a day and its taking a toll on her. She ask does she have anything else she can take tonight to that will help her sleep, was given the PRN medication to help aid in sleep. Will continue to monitor with Q 15 minute checks.    Cleo Butler-Nicholson, LPN

## 2020-10-15 NOTE — Progress Notes (Addendum)
Patient progressively becomes more anxious throughout the day. Vistaril PRN was given at 12:38pm. Patient returned to the nursing station in tears, stating she needed to speak to a Child psychotherapist, pointing to Publishing rights manager. Patient was encouraged to tell this writer what she was worried about. Patient would not elaborate. She is restless and circles around the nurses station a few times. Stress ball was given to patient and patient was encouraged to journal. Klonopin PRN was given for anxiety.

## 2020-10-15 NOTE — Progress Notes (Signed)
Patient is anxious and tearful on approach. She denies SI, HI, and AVH. However, she appears internally preoccupied and easily distracted. Patient states that she has not been sleeping well and was able to fall asleep last night, but woke up in the middle of the night. She states that it is her "thoughts" that keep her up. Patient also says that "a lot of things have happened" but will not elaborate. Patient repeats this multiple times. Patient is compliant with scheduled medications. She is active on the unit and is seen coloring in the dayroom with other patients. Support and encouragement provided.  Patient remains safe on the unit at this time.
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
## 2020-10-15 NOTE — [facility] Group Notes (Signed)
LCSW Group Therapy Note     10/15/2020 4:03 PM     Type of Therapy and Topic:  Group Therapy:  Overcoming Obstacles     Participation Level:  Minimal     Description of Group:     In this group patients will be encouraged to explore what they see as obstacles to their own wellness and recovery. They will be guided to discuss their thoughts, feelings, and behaviors related to these obstacles. The group will process together ways to cope with barriers, with attention given to specific choices patients can make. Each patient will be challenged to identify changes they are motivated to make in order to overcome their obstacles. This group will be process-oriented, with patients participating in exploration of their own experiences as well as giving and receiving support and challenge from other group members.     Therapeutic Goals:  1.    Patient will identify personal and current obstacles as they relate to admission.  2.    Patient will identify barriers that currently interfere with their wellness or overcoming obstacles.  3.    Patient will identify feelings, thought process and behaviors related to these barriers.  4.    Patient will identify two changes they are willing to make to overcome these obstacles:        Summary of Patient Progress: Patient was present for some of the group session.  Patient was unable to answer question when asked by CSW. She stated she was "confused."    Therapeutic Modalities:    Cognitive Behavioral Therapy  Solution Focused Therapy  Motivational Interviewing  Relapse Prevention Therapy     Kacee Koren [place], MSW, LCSW-A  10/15/2020 4:03 PM

## 2020-10-15 NOTE — Progress Notes (Signed)
Recreation Therapy Notes  Date: 10/15/2020  Time: 9:40 am   Location: Craft room   Behavioral response: Appropriate  Intervention Topic: Goals    Discussion/Intervention:  Group content on today was focused on goals. Patients described what goals are and how they define goals. Individuals expressed how they go about setting goals and reaching them. The group identified how important goals are and if they make short term goals to reach long term goals. Patients described how many goals they work on at a time and what affects them not reaching their goal. Individuals described how much time they put into planning and obtaining their goals. The group participated in the intervention "My Goal Board" and made personal goal boards to help them achieve their goal. Clinical Observations/Feedback: Patient came to group and defined goals as steps for the future. She explained that she makes goals by thinking of things she likes to do. Participant stated that goals are important  to keep you going toward the future.  Ricki Vanhandel LRT/CTRS         Shabre Kreher 10/15/2020 12:44 PM

## 2020-10-15 NOTE — Progress Notes (Signed)
Recreation Therapy Notes  INPATIENT RECREATION TR PLAN  Patient Details Name: Gina Munoz MRN: [id number] DOB: [birth year] Today's Date: 10/15/2020  Rec Therapy Plan Is patient appropriate for Therapeutic Recreation?: Yes Treatment times per week: at least 3 Estimated Length of Stay: 5-7 days TR Treatment/Interventions: Group participation (Comment)  Discharge Criteria Pt will be discharged from therapy if:: Discharged Treatment plan/goals/alternatives discussed and agreed upon by:: Patient/family  Discharge Summary     Elba Schaber 10/15/2020, 4:24 PM

## 2020-10-15 NOTE — Progress Notes (Signed)
Recreation Therapy Notes  INPATIENT RECREATION THERAPY ASSESSMENT  Patient Details Name: Gina Munoz MRN: [id number] DOB: [birth year] Today's Date: 10/15/2020       Information Obtained From: Patient  Able to Participate in Assessment/Interview: Yes  Patient Presentation: Responsive  Reason for Admission (Per Patient): Active Symptoms  Patient Stressors:    Coping Skills:   Exercise, Other (Comment) (Drive)  Leisure Interests (2+):  Exercise - Walking, Exercise - Running, Exercise - Aerobics Class, Exercise - Lifting Weights, Exercise - Jogging, Individual - Writing  Frequency of Recreation/Participation: Weekly  Awareness of Community Resources:  Yes  Community Resources:  Gym, Warehouse manager  Current Use: Yes  If no, Barriers?:    Expressed Interest in [employer] Information: Yes  [place]idence[place]  Patient Main Form of Transportation: Set designer  Patient Strengths:  Energetic  Patient Identified Areas of Improvement:  Be honest with my parents  Patient Goal for Hospitalization:  Talk to someone about my issues  Current SI (including self-harm):  No  Current HI:  No  Current AVH: No  Staff Intervention Plan: Group Attendance, Collaborate with Interdisciplinary Treatment Team  Consent to Intern Participation: N/A  Ceazia Harb 10/15/2020, 4:24 PM

## 2020-10-15 NOTE — [facility] Group Notes (Signed)
[facility] Group Notes:  (Nursing/MHT/Case Management/Adjunct)  Date:  10/15/2020  Time:  8:28 PM  Type of Therapy:  Group Therapy  Participation Level:  Did Not Attend  Mayra Neer 10/15/2020, 8:28 PM

## 2020-10-15 NOTE — Progress Notes (Signed)
Patient back at the nurses station complaining that she can not sleep. Wanting medication to help aid in sleep. She is very tearful and anxious. Patient given PRN medication to help aid in sleep.      Cleo Butler-Nicholson, LPN

## 2020-10-15 NOTE — Tx Team (Signed)
Interdisciplinary Treatment and Diagnostic Plan Update  10/15/2020 Time of Session: 9:00AM Gina Munoz MRN: [id number]  Principal Diagnosis: <principal problem not specified>  Secondary Diagnoses: Active Problems:   Psychotic disorder with hallucinations (HCC)   Current Medications:  Current Facility-Administered Medications  Medication Dose Route Frequency Provider Last Rate Last Admin   acetaminophen (TYLENOL) tablet 650 mg  650 mg Oral Q6H PRN Patrecia Pour, NP       alum & mag hydroxide-simeth (MAALOX/MYLANTA) 200-200-20 MG/5ML suspension 30 mL  30 mL Oral Q4H PRN Patrecia Pour, NP       hydrOXYzine (ATARAX/VISTARIL) tablet 50 mg  50 mg Oral Q6H PRN Rulon Sera, MD   50 mg at 10/14/20 2349   magnesium hydroxide (MILK OF MAGNESIA) suspension 30 mL  30 mL Oral Daily PRN Patrecia Pour, NP       risperiDONE (RISPERDAL) tablet 0.5 mg  0.5 mg Oral BID Patrecia Pour, NP   0.5 mg at 10/15/20 0736   traZODone (DESYREL) tablet 50 mg  50 mg Oral QHS Rulon Sera, MD   50 mg at 10/14/20 2127   PTA Medications: No medications prior to admission.    Patient Stressors: Educational concerns Occupational concerns  Patient Strengths: Physical Health Supportive family/friends  Treatment Modalities: Medication Management, Group therapy, Case management,  1 to 1 session with clinician, Psychoeducation, Recreational therapy.   Physician Treatment Plan for Primary Diagnosis: <principal problem not specified> Long Term Goal(s): Improvement in symptoms so as ready for discharge   Short Term Goals: Ability to verbalize feelings will improve Ability to identify changes in lifestyle to reduce recurrence of condition will improve  Medication Management: Evaluate patient's response, side effects, and tolerance of medication regimen.  Therapeutic Interventions: 1 to 1 sessions, Unit Group sessions and Medication administration.  Evaluation of Outcomes: Not Met  Physician  Treatment Plan for Secondary Diagnosis: Active Problems:   Psychotic disorder with hallucinations ([place])  Long Term Goal(s): Improvement in symptoms so as ready for discharge   Short Term Goals: Ability to verbalize feelings will improve Ability to identify changes in lifestyle to reduce recurrence of condition will improve     Medication Management: Evaluate patient's response, side effects, and tolerance of medication regimen.  Therapeutic Interventions: 1 to 1 sessions, Unit Group sessions and Medication administration.  Evaluation of Outcomes: Not Met   RN Treatment Plan for Primary Diagnosis: <principal problem not specified> Long Term Goal(s): Knowledge of disease and therapeutic regimen to maintain health will improve  Short Term Goals: Ability to remain free from injury will improve, Ability to participate in decision making will improve, Ability to verbalize feelings will improve, Ability to identify and develop effective coping behaviors will improve, and Compliance with prescribed medications will improve  Medication Management: RN will administer medications as ordered by provider, will assess and evaluate patient's response and provide education to patient for prescribed medication. RN will report any adverse and/or side effects to prescribing provider.  Therapeutic Interventions: 1 on 1 counseling sessions, Psychoeducation, Medication administration, Evaluate responses to treatment, Monitor vital signs and CBGs as ordered, Perform/monitor CIWA, COWS, AIMS and Fall Risk screenings as ordered, Perform wound care treatments as ordered.  Evaluation of Outcomes: Not Met   LCSW Treatment Plan for Primary Diagnosis: <principal problem not specified> Long Term Goal(s): Safe transition to appropriate next level of care at discharge, Engage patient in therapeutic group addressing interpersonal concerns.  Short Term Goals: Engage patient in aftercare planning with referrals and  resources,  Increase social support, Increase ability to appropriately verbalize feelings, Facilitate acceptance of mental health diagnosis and concerns, Identify triggers associated with mental health/substance abuse issues, and Increase skills for wellness and recovery  Therapeutic Interventions: Assess for all discharge needs, 1 to 1 time with Social worker, Explore available resources and support systems, Assess for adequacy in community support network, Educate family and significant other(s) on suicide prevention, Complete Psychosocial Assessment, Interpersonal group therapy.  Evaluation of Outcomes: Not Met   Progress in Treatment: Attending groups: No. Participating in groups: No. Taking medication as prescribed: Yes. Toleration medication: Yes. Family/Significant other contact made: Yes, individual(s) contacted:  Jeffie Pollock, father. Patient understands diagnosis: No. Discussing patient identified problems/goals with staff: Yes. Medical problems stabilized or resolved: Yes. Denies suicidal/homicidal ideation: Yes. Issues/concerns per patient self-inventory: No. Other: none.  New problem(s) identified: No, Describe:  none.  New Short Term/Long Term Goal(s): elimination of symptoms of psychosis, medication management for mood stabilization; elimination of SI thoughts; development of comprehensive mental wellness plan.  Patient Goals: "I've been trying to find a way to distract myself but I should talk to someone about what's going on in my head."   Discharge Plan or Barriers: CSW will assist pt with development of an appropriate aftercare/discharge plan.   Reason for Continuation of Hospitalization: Hallucinations Medication stabilization  Estimated Length of Stay: 1-7 days  Attendees: Patient: Gina Munoz 10/15/2020 9:06 AM  Physician: Selina Cooley, MD 10/15/2020 9:06 AM  Nursing: Marla Roe, RN 10/15/2020 9:06 AM  RN Care Manager: 10/15/2020 9:06 AM  Social  Worker: Chalmers Guest. Guerry Bruin, MSW, [place], [place] 10/15/2020 9:06 AM  Recreational Therapist: Devin Going, LRT  10/15/2020 9:06 AM  Other: Kiva Martinique, MSW, LCSW-A 10/15/2020 9:06 AM  Other: Assunta Curtis, MSW, LCSW 10/15/2020 9:06 AM  Other: Waldon Merl, NP 10/15/2020 9:06 AM    Scribe for Treatment Team: Shirl Harris, LCSW 10/15/2020 9:06 AM

## 2020-10-16 NOTE — Progress Notes (Signed)
Recreation Therapy Notes  Date: 10/16/2020  Time: 10:30 am   Location: Craft room   Behavioral response: Appropriate  Intervention Topic: Coping skills    Discussion/Intervention:  Group content on today was focused on coping skills. The group defined what coping skills are and when they normally use coping skills. Individuals described how they normally cope with thing and the coping skills they normally use. Patients expressed why it is important to cope with things and how not coping with things can affect you. The group participated in the intervention "My coping box" and made coping boxes while adding coping skills they could use in the future to the box. Clinical Observations/Feedback: Patient came to group late due to unknown reasons. She expressed that coping skills are important because it helps you take your mind off things. Individual was social with peers and staff while participating in the intervention.  Baylin Cabal LRT/CTRS         Maebel Marasco 10/16/2020 12:30 PM

## 2020-10-16 NOTE — Progress Notes (Addendum)
Patient is alert and oriented x4. She is tearful on approach and appears anxious and guarded. Thoughts appear more organized than previous encounters. She denies SI, HI, and AVH. She endorses depression and rates it as a 7/10. Patient says that she is on her menstrual cycle, but asks about a pregnancy test and appears worried. Patient was told that her pregnancy test result on 10/14/20 was negative. Patient endorses 7/10 pain from menstrual cramping and was given Tylenol PRN. Patient is compliant with scheduled medications.  Patient states that she slept much better last night and is feeling less anxious this morning. Patient is observed to be in the dayroom after breakfast and interacts appropriately with staff and other patients on the unit. Patient remains safe on the unit at this time.

## 2020-10-16 NOTE — [facility] Group Notes (Signed)
LCSW Group Therapy Note  10/16/2020 2:17 PM  Type of Therapy/Topic:  Group Therapy:  Feelings about Diagnosis  Participation Level:  Active   Description of Group:   This group will allow patients to explore their thoughts and feelings about diagnoses they have received. Patients will be guided to explore their level of understanding and acceptance of these diagnoses. Facilitator will encourage patients to process their thoughts and feelings about the reactions of others to their diagnosis and will guide patients in identifying ways to discuss their diagnosis with significant others in their lives. This group will be process-oriented, with patients participating in exploration of their own experiences, giving and receiving support, and processing challenge from other group members.   Therapeutic Goals: Patient will demonstrate understanding of diagnosis as evidenced by identifying two or more symptoms of the disorder Patient will be able to express two feelings regarding the diagnosis Patient will demonstrate their ability to communicate their needs through discussion and/or role play  Summary of Patient Progress: Patient was present in group and an active member.  However, patient was tangential and made loose associations throughout group.  Patient ruminated on relationships with significant others after another group member mentioned significant others.  CSW had to redirect pt several times to topic at hand and encouraged patient to relate her concerns to diagnosis.  Patient appeared confused, at one instance she indicated that her family was helpful, however, that changed in the next and she stated that her family was unhelpful.    Therapeutic Modalities:   Cognitive Behavioral Therapy Brief Therapy Feelings Identification   Penni Homans, MSW, LCSW 10/16/2020 2:17 PM

## 2020-10-16 NOTE — Progress Notes (Signed)
Patient alert and oriented x 4, affect is blunted thoughts are organized and coherent she denies SI/HI/AVH, she was noted interacting appropriately with peers and staff ,no distress noted, 15 minutes safety checks maintained will continue to monitor.

## 2020-10-16 NOTE — Progress Notes (Signed)
Patient ID: Cyenna Rebello, female   DOB: [birth year], 20 y.o.   MRN: [id number] [facility] MD Progress Note  10/16/2020 2:10 PM Makalyn Lennox  MRN:  [id number] Sadye Kiernan is a 20 year old female who presented to the ED with her father and boyfriend after little sleep and bizarre over the past week.  Patient and family deny any prior psychiatric hospitalizations or treatment.  Patient's symptoms started after vacationing at the beach last month, and have become increasingly worse.  CC: "I am talking more to other people." Subjective: Patient was seen and interviewed this afternoon.  She was observed in the milieu earlier in the day, not as anxious that she appeared to be yesterday.  She denies any suicidal or homicidal ideations.  Denies auditory or visual hallucinations or paranoia.   Speech more linear.  Still presents somewhat confused/fearful.  Continues to perseverate on "there is just so much going on." She said that she slept a lot better last night, 6.25 hours according to chart.  She has been in the day room eating.  She is journaling her thoughts and reports that this is helpful.  She says that she misses her family and boyfriend.  Encouraged patient and offered support.  She was not tearful with writer this afternoon.  Writer encouraged patient to go to group, and she reluctantly went.  No unsafe behaviors have been noted.  She has received Tylenol for menstrual cramps.  No other as needed medication for anxiety has been given this shift.    Principal Problem: Psychotic disorder with hallucinations (HCC) Diagnosis: Principal Problem:   Psychotic disorder with hallucinations (HCC)  Total Time spent with patient: 20 minutes  Past Psychiatric History: Denies.  Says that she is an "anxious" person  Past Medical History: History reviewed. No pertinent past medical history. History reviewed. No pertinent surgical history. Family History: History reviewed. No pertinent family  history.  Family Psychiatric  History: Per H&P, patient's brother had depression as a teenager  Social History:  Social History   Substance and Sexual Activity  Alcohol Use Not Currently     Social History   Substance and Sexual Activity  Drug Use Not Currently    Social History   Socioeconomic History   Marital status: Single    Spouse name: Not on file   Number of children: Not on file   Years of education: Not on file   Highest education level: Not on file  Occupational History   Not on file  Tobacco Use   Smoking status: Never    Passive exposure: Never   Smokeless tobacco: Never  Vaping Use   Vaping Use: Every day  Substance and Sexual Activity   Alcohol use: Not Currently   Drug use: Not Currently   Sexual activity: Yes  Other Topics Concern   Not on file  Social History Narrative   Not on file   Social Determinants of Health   Financial Resource Strain: Not on file  Food Insecurity: Not on file  Transportation Needs: Not on file  Physical Activity: Not on file  Stress: Not on file  Social Connections: Not on file   Additional Social History:      Sleep: Poor  Appetite:  Fair  Current Medications: Current Facility-Administered Medications  Medication Dose Route Frequency Provider Last Rate Last Admin   acetaminophen (TYLENOL) tablet 650 mg  650 mg Oral Q6H PRN Charm Rings, NP   650 mg at 10/16/20 0826   alum &  mag hydroxide-simeth (MAALOX/MYLANTA) 200-200-20 MG/5ML suspension 30 mL  30 mL Oral Q4H PRN Charm Rings, NP       clonazePAM Scarlette Calico) tablet 0.5 mg  0.5 mg Oral TID PRN Jesse Sans, MD   0.5 mg at 10/16/20 5284   hydrOXYzine (ATARAX/VISTARIL) tablet 50 mg  50 mg Oral Q6H PRN Reggie Pile, MD   50 mg at 10/15/20 1238   magnesium hydroxide (MILK OF MAGNESIA) suspension 30 mL  30 mL Oral Daily PRN Charm Rings, NP       risperiDONE (RISPERDAL) tablet 1 mg  1 mg Oral BID Vanetta Mulders, NP   1 mg at 10/16/20 1324    traZODone (DESYREL) tablet 50 mg  50 mg Oral QHS Reggie Pile, MD   50 mg at 10/15/20 2200    Lab Results:  Results for orders placed or performed during the hospital encounter of 10/13/20 (from the past 48 hour(s))  Lipid panel     Status: None   Collection Time: 10/15/20  7:38 AM  Result Value Ref Range   Cholesterol 143 0 - 200 mg/dL   Triglycerides 401 <027 mg/dL   HDL 48 >25 mg/dL   Total CHOL/HDL Ratio 3.0 RATIO   VLDL 20 0 - 40 mg/dL   LDL Cholesterol 75 0 - 99 mg/dL    Comment:        Total Cholesterol/HDL:CHD Risk Coronary Heart Disease Risk Table                     Men   Women  1/2 Average Risk   3.4   3.3  Average Risk       5.0   4.4  2 X Average Risk   9.6   7.1  3 X Average Risk  23.4   11.0        Use the calculated Patient Ratio above and the CHD Risk Table to determine the patient's CHD Risk.        ATP III CLASSIFICATION (LDL):  <100     mg/dL   Optimal  366-440  mg/dL   Near or Above                    Optimal  130-159  mg/dL   Borderline  347-425  mg/dL   High  >956     mg/dL   Very High Performed at [facility], [address] [place], [place] [address]   Hemoglobin A1c     Status: None   Collection Time: 10/15/20  7:38 AM  Result Value Ref Range   Hgb A1c MFr Bld 5.2 4.8 - 5.6 %    Comment: (NOTE) Pre diabetes:          5.7%-6.4%  Diabetes:              >6.4%  Glycemic control for   <7.0% adults with diabetes    Mean Plasma Glucose 102.54 mg/dL    Comment: Performed at [facility], [address] [place], [place] [address]  TSH     Status: None   Collection Time: 10/15/20  7:38 AM  Result Value Ref Range   TSH 2.364 0.350 - 4.500 uIU/mL    Comment: Performed by a 3rd Generation assay with a functional sensitivity of <=0.01 uIU/mL. Performed at [facility], [address], [place], [place] [address]     Blood Alcohol level:  Lab Results  Component Value Date  ETH <10 10/13/2020    Metabolic Disorder  Labs: Lab Results  Component Value Date   HGBA1C 5.2 10/15/2020   MPG 102.54 10/15/2020   No results found for: PROLACTIN Lab Results  Component Value Date   CHOL 143 10/15/2020   TRIG 100 10/15/2020   HDL 48 10/15/2020   CHOLHDL 3.0 10/15/2020   VLDL 20 10/15/2020   LDLCALC 75 10/15/2020    Physical Findings: AIMS:  , ,  ,  ,    CIWA:    COWS:     Musculoskeletal: Strength & Muscle Tone: within normal limits Gait & Station: normal Patient leans: N/A  Psychiatric Specialty Exam:  Presentation  General Appearance:  Appropriate for Environment Eye Contact: Good Speech: Clear and Coherent Speech Volume: Normal Handedness: No data recorded  Mood and Affect  Mood: Anxious (stated "better") Affect: Congruent  Thought Process  Thought Processes: Disorganized (mild improvement) Descriptions of Associations:Intact Orientation:Full (Time, Place and Person) Thought Content:Perseveration; Tangential; Scattered History of Schizophrenia/Schizoaffective disorder:No  Duration of Psychotic Symptoms:Less than six months  Hallucinations:Hallucinations: None (Denies) Ideas of Reference:None Suicidal Thoughts:Suicidal Thoughts: No (Denies) Homicidal Thoughts:Homicidal Thoughts: No (Denies)  Sensorium  Memory: Immediate Good; Recent Good Judgment: Impaired Insight: Fair  Chartered certified accountant: Fair Attention Span: Fair Recall: [employer] of Knowledge: Fair Language: Fair  Psychomotor Activity  Psychomotor Activity: Psychomotor Activity: Normal  Assets  Assets: Desire for Improvement; Financial Resources/Insurance; Housing; Social Support; Resilience; Physical Health  Sleep  Sleep: Sleep: Poor Number of Hours of Sleep: 6.25   Physical Exam: Physical Exam Vitals and nursing note reviewed.  HENT:     Head: Normocephalic.  Eyes:     General:        Right eye: No discharge.        Left eye: No discharge.  Cardiovascular:      Rate and Rhythm: Normal rate.  Pulmonary:     Effort: Pulmonary effort is normal.  Musculoskeletal:        General: Normal range of motion.     Cervical back: Normal range of motion.  Skin:    General: Skin is dry.  Neurological:     Mental Status: She is alert and oriented to person, place, and time.  Psychiatric:        Attention and Perception: Attention normal.        Speech: Speech normal.        Behavior: Behavior is cooperative.        Cognition and Memory: Cognition normal.   Review of Systems  Psychiatric/Behavioral:  Positive for depression. The patient is nervous/anxious.   All other systems reviewed and are negative. Blood pressure 118/63, pulse 85, temperature 98 F (36.7 C), temperature source Oral, resp. rate 17, height 5' (1.524 m), weight 50.3 kg, SpO2 100 %. Body mass index is 21.68 kg/m.   Treatment Plan Summary: Daily contact with patient to assess and evaluate symptoms and progress in treatment and Medication management 10/16/2020: Patient being treated with medication and milieu activities for psychosis.  She has some mild improvement today.  Anxiety and confusion are a little less per her report and observation only.  Continued hospitalization required for safety, stability, and clarity of diagnosis.  Psychosis - Continue risperidone 1 mg 2 times daily  Insomnia - Continue trazodone 50 mg tablet oral daily at bedtime  Anxiety/insomnia - Start clonazepam 0.5 mg tablet oral 3 times daily as needed - Continue hydroxyzine 50 mg tablet oral every 6 hours as needed  PRN, Other  --Continue Tylenol 650 mg po every 6 hrs prn pain --Continue MAALOX/MYLANTA 30 mL po every 4 hrs prn indigestion --Continue Milk of Magnesia 30 mL po daily prn constipation     Vanetta MuldersLouise F Raelan Burgoon, NP 10/16/2020, 2:10 PM

## 2020-10-17 DIAGNOSIS — F29 Unspecified psychosis not due to a substance or known physiological condition: Secondary | ICD-10-CM

## 2020-10-17 MED ORDER — RISPERIDONE 1 MG PO TABS
2.0000 mg | ORAL_TABLET | Freq: Two times a day (BID) | ORAL | Status: DC
  Administered 2020-10-17 – 2020-10-19 (×4): 2 mg via ORAL
  Filled 2020-10-17 (×4): qty 2

## 2020-10-17 NOTE — Progress Notes (Signed)
Patient denies SI,HI and AVH. 

## 2020-10-17 NOTE — Progress Notes (Signed)
D: Pt alert and oriented. Pt rates depression 4/10, hopelessness 4/10, and anxiety 8/10. Pt goal: "To get better to be with my family." Pt reports energy level as normal and concentration as being good. Pt reports sleep last night as being good. Pt did receive medications for sleep and did find them helpful. Pt denies experiencing any pain at this time. Pt denies experiencing any SI/HI, or AVH at this time.   Pt is very  tearful today. Pt is learning coping skills to help with missing family, handling emotions, and over thinking.   A: Scheduled medications administered to pt, per MD orders. Support and encouragement provided. Frequent verbal contact made. Routine safety checks conducted q15 minutes.   R: No adverse drug reactions noted. Pt verbally contracts for safety at this time. Pt complaint with medications and treatment plan. Pt interacts well with others on the unit. Pt remains safe at this time. Will continue to monitor.

## 2020-10-17 NOTE — [facility] Group Notes (Signed)
LCSW Group Therapy Note  10/17/2020 3:18 PM  Type of Therapy/Topic:  Group Therapy:  Emotion Regulation  Participation Level:  Active   Description of Group:   The purpose of this group is to assist patients in learning to regulate negative emotions and experience positive emotions. Patients will be guided to discuss ways in which they have been vulnerable to their negative emotions. These vulnerabilities will be juxtaposed with experiences of positive emotions or situations, and patients will be challenged to use positive emotions to combat negative ones. Special emphasis will be placed on coping with negative emotions in conflict situations, and patients will process healthy conflict resolution skills.  Therapeutic Goals: Patient will identify two positive emotions or experiences to reflect on in order to balance out negative emotions Patient will label two or more emotions that they find the most difficult to experience Patient will demonstrate positive conflict resolution skills through discussion and/or role plays  Summary of Patient Progress: Pt was present for the entirety of group. She spoke about feeling overwhelmed, stressed, and tired prior to admission. Pt shared that she was used to keeping her emotions to herself which she acknowledged as something that she needed to work on. Pt stated that when she is stressed so will go to the gym or talk to someone about it. She also stated that it is important to talk to someone who has training in the area that she needs to work on. Her comments and feedback were pertinent to the discussion.   Therapeutic Modalities:   Cognitive Behavioral Therapy Feelings Identification Dialectical Behavioral Therapy  Vilma Meckel. Algis Greenhouse, MSW, LCSW, LCAS 10/17/2020 3:18 PM

## 2020-10-17 NOTE — Progress Notes (Signed)
   10/17/20 1445  Clinical Encounter Type  Visited With Patient  Visit Type Initial;Spiritual support;Social support  Referral From Other (Comment) (rounding)  Gina Munoz met with Pt for the first time at Pt's request. Gina Munoz provided a compassionate, supportive presence and reflective listening. Pt shared some of her story, events that have felt traumatic or challenging and also what her passions and goals are. Chaplain helped to mirror back to the Pt the strengths and goals she has named and to help her feel more confident in her emotional growth and to she her own clear vision of herself. We also talked about strategies when responding to bullies and other issues related to forming trusting, healthy relationships.

## 2020-10-17 NOTE — Plan of Care (Signed)
  Problem: Education: Goal: Knowledge of [facility] Education information/materials will improve Outcome: Progressing Goal: Emotional status will improve Outcome: Progressing Goal: Mental status will improve Outcome: Progressing Goal: Verbalization of understanding the information provided will improve Outcome: Progressing   Problem: Activity: Goal: Interest or engagement in activities will improve Outcome: Progressing Goal: Sleeping patterns will improve Outcome: Progressing   Problem: Coping: Goal: Ability to verbalize frustrations and anger appropriately will improve Outcome: Progressing Goal: Ability to demonstrate self-control will improve Outcome: Progressing   Problem: Health Behavior/Discharge Planning: Goal: Identification of resources available to assist in meeting health care needs will improve Outcome: Progressing Goal: Compliance with treatment plan for underlying cause of condition will improve Outcome: Progressing   Problem: Physical Regulation: Goal: Ability to maintain clinical measurements within normal limits will improve Outcome: Progressing   Problem: Safety: Goal: Periods of time without injury will increase Outcome: Progressing   Problem: Activity: Goal: Will verbalize the importance of balancing activity with adequate rest periods Outcome: Progressing   Problem: Education: Goal: Will be free of psychotic symptoms Outcome: Progressing Goal: Knowledge of the prescribed therapeutic regimen will improve Outcome: Progressing   Problem: Coping: Goal: Coping ability will improve Outcome: Progressing Goal: Will verbalize feelings Outcome: Progressing   Problem: Health Behavior/Discharge Planning: Goal: Compliance with prescribed medication regimen will improve Outcome: Progressing   Problem: Nutritional: Goal: Ability to achieve adequate nutritional intake will improve Outcome: Progressing   Problem: Role Relationship: Goal:  Ability to communicate needs accurately will improve Outcome: Progressing Goal: Ability to interact with others will improve Outcome: Progressing   Problem: Safety: Goal: Ability to redirect hostility and anger into socially appropriate behaviors will improve Outcome: Progressing Goal: Ability to remain free from injury will improve Outcome: Progressing   Problem: Self-Care: Goal: Ability to participate in self-care as condition permits will improve Outcome: Progressing   Problem: Self-Concept: Goal: Will verbalize positive feelings about self Outcome: Progressing   

## 2020-10-17 NOTE — Progress Notes (Signed)
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 No acute events overnight, medication compliant, attending to ADLs. Patient seen one-on-one this morning, and she is very tearful. She continues to repeat that I already know what is happening. She also keeps asking what the doctors are saying about her. This is said in such a way that it seems that she thinks we are able to read her mind. Recounted what she had informed the team so far about feeling stressed out about school, work, and family. Reviewed the diagnoses of brief psychosis vs. Bipolar disorder. We also reviewed her current medications and indications. She appeared slightly calmer after discussion. She denies any suicidal ideations, homicidal ideations, visual hallucinations, or auditory hallucinations. However, patient is still exhibiting active psychosis, confusion, and inability to care for herself. Will increase risperdal to 2mg  twice daily.   Principal Problem: Psychotic disorder with hallucinations (HCC) Diagnosis: Principal Problem:   Psychotic disorder with hallucinations (HCC)  Total Time spent with patient: 30 minutes  Past Psychiatric History: See H&P  Past Medical History: History reviewed. No pertinent past medical history. History reviewed. No pertinent surgical history. Family History: History reviewed. No pertinent family history. Family Psychiatric  History: See H&P Social History:  Social History   Substance and Sexual Activity  Alcohol Use Not Currently     Social History   Substance and Sexual Activity  Drug Use Not Currently    Social History   Socioeconomic History   Marital status: Single    Spouse name: Not on file   Number of children: Not on file   Years of education: Not on file    Highest education level: Not on file  Occupational History   Not on file  Tobacco Use   Smoking status: Never    Passive exposure: Never   Smokeless tobacco: Never  Vaping Use   Vaping Use: Every day  Substance and Sexual Activity   Alcohol use: Not Currently   Drug use: Not Currently   Sexual activity: Yes  Other Topics Concern   Not on file  Social History Narrative   Not on file   Social Determinants of Health   Financial Resource Strain: Not on file  Food Insecurity: Not on file  Transportation Needs: Not on file  Physical Activity: Not on file  Stress: Not on file  Social Connections: Not on file   Additional Social History:                         Sleep: Fair  Appetite:  Fair  Current Medications: Current Facility-Administered Medications  Medication Dose Route Frequency Provider Last Rate Last Admin   acetaminophen (TYLENOL) tablet 650 mg  650 mg Oral Q6H PRN , NP   650 mg at 10/16/20 0826   alum & mag hydroxide-simeth (MAALOX/MYLANTA) 200-200-20 MG/5ML suspension 30 mL  30 mL Oral Q4H PRN 09-02-2000, NP       clonazePAM Charm Rings) tablet 0.5 mg  0.5 mg Oral TID PRN Scarlette Calico, MD   0.5 mg at 10/17/20 1043   hydrOXYzine (ATARAX/VISTARIL) tablet 50 mg  50 mg Oral Q6H PRN 10/19/20, MD   50 mg at 10/17/20 10/19/20   magnesium hydroxide (MILK OF MAGNESIA) suspension 30 mL  30 mL Oral Daily PRN Charm Rings, NP       risperiDONE (RISPERDAL) tablet 1 mg  1 mg Oral BID Gabriel Cirri F, NP   1 mg at 10/17/20 7416   traZODone (DESYREL) tablet 50 mg  50 mg Oral QHS Reggie Pile, MD   50 mg at 10/16/20 2107    Lab Results: No results found for this or any previous visit (from the past 48 hour(s)).  Blood Alcohol level:  Lab Results  Component Value Date   ETH <10 10/13/2020    Metabolic Disorder Labs: Lab Results  Component Value Date   HGBA1C 5.2 10/15/2020   MPG 102.54 10/15/2020   No results found for: PROLACTIN Lab  Results  Component Value Date   CHOL 143 10/15/2020   TRIG 100 10/15/2020   HDL 48 10/15/2020   CHOLHDL 3.0 10/15/2020   VLDL 20 10/15/2020   LDLCALC 75 10/15/2020    Physical Findings: AIMS:  , ,  ,  ,    CIWA:    COWS:     Musculoskeletal: Strength & Muscle Tone: within normal limits Gait & Station: normal Patient leans: N/A  Psychiatric Specialty Exam:  Presentation  General Appearance: Appropriate for Environment  Eye Contact:Fair  Speech:Blocked  Speech Volume:Normal  Handedness:Right   Mood and Affect  Mood:Anxious; Dysphoric  Affect:Congruent; Tearful   Thought Process  Thought Processes:Disorganized  Descriptions of Associations:Intact  Orientation:Full (Time, Place and Person)  Thought Content:Perseveration; Scattered; Tangential  History of Schizophrenia/Schizoaffective disorder:No  Duration of Psychotic Symptoms:Greater than six months  Hallucinations:Hallucinations: None  Ideas of Reference:None  Suicidal Thoughts:Suicidal Thoughts: No  Homicidal Thoughts:Homicidal Thoughts: No   Sensorium  Memory:Immediate Fair; Recent Fair; Remote Fair  Judgment:Impaired  Insight:Shallow   Executive Functions  Concentration:Poor  Attention Span:Poor  Recall:Fair  Fund of Knowledge:Fair  Language:Fair   Psychomotor Activity  Psychomotor Activity:Psychomotor Activity: Normal   Assets  Assets:Desire for Improvement; Housing; Social Support; Physical Health; Vocational/Educational   Sleep  Sleep:Sleep: Fair Number of Hours of Sleep: 6.25    Physical Exam: Physical Exam ROS Blood pressure 115/66, pulse 80, temperature 97.7 F (36.5 C), temperature source Oral, resp. rate 17, height 5' (1.524 m), weight 50.3 kg, SpO2 100 %. Body mass index is 21.68 kg/m.   Treatment Plan Summary: Daily contact with patient to assess and evaluate symptoms and progress in treatment and Medication management48 year old female here for poor  sleep and bizarre behaviors. 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 Increase Risperdal to 2 mg twice daily. Continue Clonopin 0.5 mg TID PRN for anxiety and insomnia.   Jesse Sans, MD 10/17/2020, 11:21 AM

## 2020-10-18 ENCOUNTER — Encounter: Payer: Self-pay | Admitting: Psychiatry

## 2020-10-18 DIAGNOSIS — F29 Unspecified psychosis not due to a substance or known physiological condition: Secondary | ICD-10-CM | POA: Diagnosis not present

## 2020-10-18 NOTE — Plan of Care (Signed)
Patient appears anxious this morning and verbalized that she is not comfortable with a per.Patient had Klonopin for anxiety. Later appears pleasant and hopeful about her future. Denies SI,HI and AVH. Appetite and energy level good. Support and encouragement given.

## 2020-10-18 NOTE — Progress Notes (Signed)
Recreation Therapy Notes  Date: 10/11/2020  Time: 9:40 am  Location: Courtyard   Behavioral response: Appropriate,Tearful   Intervention Topic: Animal Assisted Therapy   Discussion/Intervention:  Animal Assisted Therapy took place today during group.  Animal Assisted Therapy is the planned inclusion of an animal in a patient's treatment plan. The patients were able to engage in therapy with an animal during group. Participants were educated on what a service dog is and the different between a support dog and a service dog. Patient were informed on how animal needs are similar to people needs. Individuals were enlightened on the process to get a service animal or support animal. Patients got the opportunity to pet the animal and were offered emotional support from the animal and staff.  Clinical Observations/Feedback:  Patient came to group and was on topic and was focused on what peers and staff had to say. Participant shared their experiences and history with animals. Individual was social with peers, staff and animal while participating in group. Patient was tearful on and off during group due to missing her pet.  Shaunessy Dobratz LRT/CTRS          Aniya Jolicoeur 10/18/2020 12:18 PM

## 2020-10-18 NOTE — Plan of Care (Signed)
Patient presents with a better affect than when this writer had this patient earlier this admission   Problem: Education: Goal: Emotional status will improve Outcome: Progressing Goal: Mental status will improve Outcome: Progressing

## 2020-10-18 NOTE — [facility] Group Notes (Signed)
LCSW Group Therapy Note  10/18/2020 3:22 PM  Type of Therapy/Topic:  Group Therapy:  Balance in Life  Participation Level:  Active  Description of Group:    This group will address the concept of balance and how it feels and looks when one is unbalanced. Patients will be encouraged to process areas in their lives that are out of balance and identify reasons for remaining unbalanced. Facilitators will guide patients in utilizing problem-solving interventions to address and correct the stressor making their life unbalanced. Understanding and applying boundaries will be explored and addressed for obtaining and maintaining a balanced life. Patients will be encouraged to explore ways to assertively make their unbalanced needs known to significant others in their lives, using other group members and facilitator for support and feedback.  Therapeutic Goals: Patient will identify two or more emotions or situations they have that consume much of in their lives. Patient will identify signs/triggers that life has become out of balance:  Patient will identify two ways to set boundaries in order to achieve balance in their lives:  Patient will demonstrate ability to communicate their needs through discussion and/or role plays  Summary of Patient Progress: Patient was present for group.  Patient was attentive and supportive of others.  Patient disposition was appropriate.  Patient struggled somewhat with linear thought, however, was better in this group than previous group, association was loose but not as tangential.  Patient shared that balance for her means "peace".  She identified that "you'll still have to juggle but will find peace".  She identified that when unbalanced she feels "disorganized".  She identified her triggers as being "support from family".  Patient was unclear if she felt family was too supportive, not supportive enough or of she was having some difficulty with how support changes as she  becomes an adult; at one point pt stated all three.  Patient identified hugging her dog, the gym and watching something funny as her coping mechanisms.    Therapeutic Modalities:   Cognitive Behavioral Therapy Solution-Focused Therapy Assertiveness Training  Penni Homans MSW, [place] 10/18/2020 3:22 PM

## 2020-10-18 NOTE — Progress Notes (Signed)
Patient stated to this writer during assessment that she was feeling better tonight just a little anxious. Pt observed interacting appropriately with staff and peers on the unit. Pt compliant with medication administration per MD orders. Pt given education, support, and encouragement to be active in her treatment plan. Pt being monitored Q 15 minutes for safety per unit protocol. Pt remains safe on the unit.

## 2020-10-18 NOTE — Plan of Care (Signed)
Pt self isolative in room and comes out to get necessary treatment like her medications. Pt very paranoid and suspicious of her medications and their functions. Pt eventually took her hs meds. Emotional support given. Q15 safety checks maintained.  Problem: Education: Goal: Knowledge of [facility] Education information/materials will improve Outcome: Progressing Goal: Emotional status will improve Outcome: Progressing Goal: Mental status will improve Outcome: Progressing Goal: Verbalization of understanding the information provided will improve Outcome: Progressing   Problem: Activity: Goal: Interest or engagement in activities will improve Outcome: Progressing Goal: Sleeping patterns will improve Outcome: Progressing   Problem: Coping: Goal: Ability to verbalize frustrations and anger appropriately will improve Outcome: Progressing Goal: Ability to demonstrate self-control will improve Outcome: Progressing   Problem: Health Behavior/Discharge Planning: Goal: Identification of resources available to assist in meeting health care needs will improve Outcome: Progressing Goal: Compliance with treatment plan for underlying cause of condition will improve Outcome: Progressing   Problem: Physical Regulation: Goal: Ability to maintain clinical measurements within normal limits will improve Outcome: Progressing   Problem: Safety: Goal: Periods of time without injury will increase Outcome: Progressing   Problem: Activity: Goal: Will verbalize the importance of balancing activity with adequate rest periods Outcome: Progressing   Problem: Education: Goal: Will be free of psychotic symptoms Outcome: Progressing Goal: Knowledge of the prescribed therapeutic regimen will improve Outcome: Progressing   Problem: Coping: Goal: Coping ability will improve Outcome: Progressing Goal: Will verbalize feelings Outcome: Progressing   Problem: Health Behavior/Discharge  Planning: Goal: Compliance with prescribed medication regimen will improve Outcome: Progressing   Problem: Nutritional: Goal: Ability to achieve adequate nutritional intake will improve Outcome: Progressing   Problem: Role Relationship: Goal: Ability to communicate needs accurately will improve Outcome: Progressing Goal: Ability to interact with others will improve Outcome: Progressing   Problem: Safety: Goal: Ability to redirect hostility and anger into socially appropriate behaviors will improve Outcome: Progressing Goal: Ability to remain free from injury will improve Outcome: Progressing   Problem: Self-Care: Goal: Ability to participate in self-care as condition permits will improve Outcome: Progressing   Problem: Self-Concept: Goal: Will verbalize positive feelings about self Outcome: Progressing

## 2020-10-18 NOTE — Progress Notes (Signed)
[facility] MD Progress Note  10/18/2020 11:59 AM Gina Munoz  MRN:  [id number] Gina Munoz is a 20 year old female who presented to the ED with her father and boyfriend after little sleep and bizarre over the past week.  Patient and family deny any prior psychiatric hospitalizations or treatment.  Patient's symptoms started after vacationing at the beach last month, and have become increasingly worse.  CC: " I am doing a lot of processing." Subjective: Patient was seen and interviewed this morning and she was with a group outside.  She was playing "corn hole" and asked if we could talk.  Patient appears tentative when asking to discuss her thoughts with Clinical research associate.  She states that she feels bad about judging a new patient that came in last night.  She said several times, "I really am a good person.  I get thoughts that I am not because I am here."  Time spent with patient reassuring her of her worthiness.   Patient expresses that maybe she is not a good person because she is here.  Patient denies that these thoughts are auditory hallucinations.  Patient denies visual hallucinations.  Continues to deny suicidal or homicidal ideations.  She is attending to ADLs and taking medication as prescribed.  Appetite and sleep have been adequate.  Endorses depression, notably because "this has happened" and she wants to be home with her family.  No unsafe behavior has been noted.  Patient continues to say that she misses her family a lot but understands that she is having an episode where she is not thinking clearly, but is showing some improvement.  Reassurance given.  No unsafe behavior has been noted.  Patient does express hope of getting better and going back to school. Risperdal was increased yesterday.  Principal Problem: Psychotic disorder with hallucinations (HCC) Diagnosis: Principal Problem:   Psychotic disorder with hallucinations (HCC)  Total Time spent with patient: 20 minutes  Past  Psychiatric History: Denies.  Says that she is an "anxious" person  Past Medical History: History reviewed. No pertinent past medical history. History reviewed. No pertinent surgical history. Family History: History reviewed. No pertinent family history.  Family Psychiatric  History: Per H&P, patient's brother had depression as a teenager  Social History:  Social History   Substance and Sexual Activity  Alcohol Use Not Currently     Social History   Substance and Sexual Activity  Drug Use Not Currently    Social History   Socioeconomic History   Marital status: Single    Spouse name: Not on file   Number of children: Not on file   Years of education: Not on file   Highest education level: Not on file  Occupational History   Not on file  Tobacco Use   Smoking status: Never    Passive exposure: Never   Smokeless tobacco: Never  Vaping Use   Vaping Use: Every day  Substance and Sexual Activity   Alcohol use: Not Currently   Drug use: Not Currently   Sexual activity: Yes  Other Topics Concern   Not on file  Social History Narrative   ** Merged History Encounter **       Social Determinants of Health   Financial Resource Strain: Not on file  Food Insecurity: Not on file  Transportation Needs: Not on file  Physical Activity: Not on file  Stress: Not on file  Social Connections: Not on file   Additional Social History:      Sleep:  Poor  Appetite:  Fair  Current Medications: Current Facility-Administered Medications  Medication Dose Route Frequency Provider Last Rate Last Admin   acetaminophen (TYLENOL) tablet 650 mg  650 mg Oral Q6H PRN Charm Rings, NP   650 mg at 10/16/20 0826   alum & mag hydroxide-simeth (MAALOX/MYLANTA) 200-200-20 MG/5ML suspension 30 mL  30 mL Oral Q4H PRN Charm Rings, NP       clonazePAM Scarlette Calico) tablet 0.5 mg  0.5 mg Oral TID PRN Jesse Sans, MD   0.5 mg at 10/17/20 2123   hydrOXYzine (ATARAX/VISTARIL) tablet 50 mg  50  mg Oral Q6H PRN Reggie Pile, MD   50 mg at 10/18/20 6962   magnesium hydroxide (MILK OF MAGNESIA) suspension 30 mL  30 mL Oral Daily PRN Charm Rings, NP       risperiDONE (RISPERDAL) tablet 2 mg  2 mg Oral BID Jesse Sans, MD   2 mg at 10/18/20 9528   traZODone (DESYREL) tablet 50 mg  50 mg Oral QHS Reggie Pile, MD   50 mg at 10/17/20 2122    Lab Results:  No results found for this or any previous visit (from the past 48 hour(s)).   Blood Alcohol level:  Lab Results  Component Value Date   ETH <10 10/13/2020    Metabolic Disorder Labs: Lab Results  Component Value Date   HGBA1C 5.2 10/15/2020   MPG 102.54 10/15/2020   No results found for: PROLACTIN Lab Results  Component Value Date   CHOL 143 10/15/2020   TRIG 100 10/15/2020   HDL 48 10/15/2020   CHOLHDL 3.0 10/15/2020   VLDL 20 10/15/2020   LDLCALC 75 10/15/2020    Physical Findings: AIMS:  , ,  ,  ,    CIWA:    COWS:     Musculoskeletal: Strength & Muscle Tone: within normal limits Gait & Station: normal Patient leans: N/A  Psychiatric Specialty Exam:  Presentation  General Appearance:  Appropriate for Environment Eye Contact: Good Speech: Clear and Coherent Speech Volume: Normal Handedness: Right  Mood and Affect  Mood: Anxious; Dysphoric Affect: Congruent; Tearful  Thought Process  Thought Processes: Disorganized (Mild improvement) Descriptions of Associations:Intact Orientation:Full (Time, Place and Person) Thought Content:Scattered History of Schizophrenia/Schizoaffective disorder:No  Duration of Psychotic Symptoms:Greater than six months  Hallucinations:Hallucinations: None Ideas of Reference:None Suicidal Thoughts:Suicidal Thoughts: No (Denies) Homicidal Thoughts:Homicidal Thoughts: No (Denies)  Sensorium  Memory: Immediate Good Judgment: Impaired Insight: Poor  Art therapist  Concentration: Fair Attention Span: Fair Recall: [employer]  Knowledge: Fair Language: Fair  Psychomotor Activity  Psychomotor Activity: Psychomotor Activity: Decreased  Assets  Assets: Desire for Improvement; Housing; Social Support; Physical Health; Vocational/Educational  Sleep  Sleep: Sleep: Good Number of Hours of Sleep: 8.25   Physical Exam: Physical Exam Vitals and nursing note reviewed.  HENT:     Head: Normocephalic.  Eyes:     General:        Right eye: No discharge.        Left eye: No discharge.  Cardiovascular:     Rate and Rhythm: Normal rate.  Pulmonary:     Effort: Pulmonary effort is normal.  Musculoskeletal:        General: Normal range of motion.     Cervical back: Normal range of motion.  Skin:    General: Skin is dry.  Neurological:     Mental Status: She is alert and oriented to person, place, and time.  Psychiatric:  Attention and Perception: Attention normal.        Speech: Speech normal.        Behavior: Behavior is cooperative.        Cognition and Memory: Cognition normal.   Review of Systems  Psychiatric/Behavioral:  Positive for depression. The patient is nervous/anxious.   All other systems reviewed and are negative. Blood pressure 110/67, pulse 85, temperature 98.7 F (37.1 C), temperature source Oral, resp. rate 17, height 5' (1.524 m), weight 50.3 kg, SpO2 100 %. Body mass index is 21.68 kg/m.   Treatment Plan Summary: Daily contact with patient to assess and evaluate symptoms and progress in treatment and Medication management 10/18/2020: Patient being treated with medication and milieu activities for brief psychosis vs bipolar disorder. She has more linear speech today with more thought organization. Still tearful, appearing somewhat fearful. Perseverative on "being a good person." Continue with established plan. No medication changes today.   Psychosis - Continue risperidone 2 mg 2 times daily  Insomnia - Continue trazodone 50 mg tablet oral daily at  bedtime  Anxiety/insomnia - Start clonazepam 0.5 mg tablet oral 3 times daily as needed - Continue hydroxyzine 50 mg tablet oral every 6 hours as needed  PRN, Other  --Continue Tylenol 650 mg po every 6 hrs prn pain --Continue MAALOX/MYLANTA 30 mL po every 4 hrs prn indigestion --Continue Milk of Magnesia 30 mL po daily prn constipation     Vanetta Mulders, NP 10/18/2020, 11:59 AM

## 2020-10-19 DIAGNOSIS — F29 Unspecified psychosis not due to a substance or known physiological condition: Secondary | ICD-10-CM | POA: Diagnosis not present

## 2020-10-19 DIAGNOSIS — F23 Brief psychotic disorder: Secondary | ICD-10-CM | POA: Diagnosis present

## 2020-10-19 MED ORDER — RISPERIDONE 2 MG PO TABS: 2.0000 mg | ORAL_TABLET | Freq: Two times a day (BID) | ORAL | 1 refills | Status: DC

## 2020-10-19 MED ORDER — CLONAZEPAM 0.5 MG PO TABS: 0.5000 mg | ORAL_TABLET | Freq: Three times a day (TID) | ORAL | 1 refills | Status: DC | PRN

## 2020-10-19 NOTE — B[facility] Suicide Risk Assessment (Signed)
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[facility]) Discharge Diagnoses: Principal Problem:   Psychotic disorder with hallucinations (HCC)   Total Time spent with patient: 35 minutes- 25 minutes face-to-face contact with patient, 10 minutes documentation, coordination of care, scripts   Musculoskeletal: Strength & Muscle Tone: within normal limits Gait & Station: normal Patient leans: N/A  Psychiatric Specialty Exam  Presentation  General Appearance: Appropriate for Environment; Casual  Eye Contact:Good  Speech:Clear and Coherent  Speech Volume:Normal  Handedness:Right   Mood and Affect  Mood:Euthymic  Duration of Depression Symptoms: Greater than two weeks  Affect:Congruent; Full Range   Thought Process  Thought Processes:Goal Directed; Coherent  Descriptions of Associations:Intact  Orientation:Full (Time, Place and Person)  Thought Content:Logical  History of Schizophrenia/Schizoaffective disorder:No  Duration of Psychotic Symptoms:Less than six months  Hallucinations:Hallucinations: None  Ideas of Reference:None  Suicidal Thoughts:Suicidal Thoughts: No  Homicidal Thoughts:Homicidal Thoughts: No   Sensorium  Memory:Immediate Good; Recent Good; Remote Good  Judgment:Intact  Insight:Present   Executive Functions  Concentration:Good  Attention Span:Good  Recall:Good  Fund of Knowledge:Good  Language:Good   Psychomotor Activity  Psychomotor Activity:Psychomotor Activity: Normal   Assets  Assets:Communication Skills; Desire for Improvement; Housing; Physical Health; Resilience; Social Support; Talents/Skills; Transportation; Vocational/Educational   Sleep  Sleep:Sleep: Good Number of Hours of Sleep: 7.5   Physical Exam: Physical Exam ROS Blood pressure 114/67, pulse 74, temperature 97.9 F (36.6 C), temperature source Oral, resp. rate 18, height 5' (1.524 m), weight 50.3  kg, SpO2 100 %. Body mass index is 21.68 kg/m.  Mental Status Per Nursing Assessment::   On Admission:  NA  Demographic Factors:  Adolescent or young adult  Loss Factors: NA  Historical Factors: Family history of mental illness or substance abuse  Risk Reduction Factors:   Sense of responsibility to family, Religious beliefs about death, Employed, Living with another person, especially a relative, Positive social support, Positive therapeutic relationship, and Positive coping skills or problem solving skills  Continued Clinical Symptoms:  Depression:   Recent sense of peace/wellbeing  Cognitive Features That Contribute To Risk:  None    Suicide Risk:  Minimal: No identifiable suicidal ideation.  Patients presenting with no risk factors but with morbid ruminations; may be classified as minimal risk based on the severity of the depressive symptoms    Plan Of Care/Follow-up recommendations:  Activity:  as tolerated Diet:  regular diet  Jesse Sans, MD 10/19/2020, 9:30 AM

## 2020-10-19 NOTE — Progress Notes (Signed)
  [facility] Adult Case Management Discharge Plan :  Will you be returning to the same living situation after discharge:  Yes,  pt is returning home. At discharge, do you have transportation home?: Yes,  pt reports that family will provide transportation. Do you have the ability to pay for your medications: No.  Release of information consent forms completed and in the chart;  Patient's signature needed at discharge.  Patient to Follow up at:  Follow-up Information     [facility] Follow up.   Why: Appointment is scheduled for 10/24/2020 at 12:30PM.  Thanks! Contact information: [address] [place] [address] [phone]                 Next level of care provider has access to [facility] Link:no  Safety Planning and Suicide Prevention discussed: Yes,  SPE completed with the patient and father.      Has patient been referred to the Quitline?: Patient refused referral  Patient has been referred for addiction treatment: Pt. refused referral  Harden Mo, LCSW 10/19/2020, 9:37 AM

## 2020-10-19 NOTE — Progress Notes (Addendum)
Recreation Therapy Notes  Date: 10/19/2020  Time: 9:30 am   Location: Court yard  Behavioral response: Appropriate  Intervention Topic: Leisure    Discussion/Intervention:  Group content today was focused on leisure. The group defined what leisure is and some positive leisure activities they participate in. Individuals identified the difference between good and bad leisure. Participants expressed how they feel after participating in the leisure of their choice. The group discussed how they go about picking a leisure activity and if others are involved in their leisure activities. The patient stated how many leisure activities they have to choose from and reasons why it is important to have leisure time. Individuals participated in the intervention "Exploration of Leisure" where they had a chance to identify new leisure activities as well as benefits of leisure. Clinical Observations/Feedback: Patient came to group and was social with peers and staff while actively participating in leisure.  Sidonia Nutter LRT/CTRS         Tosca Pletz 10/19/2020 11:24 AM

## 2020-10-19 NOTE — Discharge Summary (Signed)
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 No pertinent past medical history. History reviewed. No pertinent surgical history. Family History: History reviewed. No pertinent family history. Family Psychiatric  History: Brother with depression Social History:  Social History   Substance and Sexual Activity  Alcohol Use Not Currently     Social History   Substance and Sexual Activity  Drug Use Not Currently    Social History   Socioeconomic History   Marital status: Single    Spouse name: Not on file   Number of children: Not on file   Years of education: Not on file   Highest education level: Not on file  Occupational History   Not on file  Tobacco Use   Smoking status: Never    Passive exposure: Never   Smokeless tobacco: Never  Vaping Use   Vaping Use: Every day  Substance and Sexual Activity   Alcohol use: Not Currently   Drug use: Not Currently   Sexual activity: Yes  Other Topics Concern   Not on file  Social History Narrative   ** Merged History Encounter **       Social Determinants of Health   Financial Resource Strain: Not on file  Food Insecurity: Not on file  Transportation Needs: Not on file  Physical Activity: Not on file  Stress: Not on file  Social  Connections: Not on file    Hospital Course:  Patient admitted for bizarre behavior and poor sleep for 1 week prior to admission. Differential diagnosis includes brief psychotic disorder vs. Bipolar I disorder with psychotic features. She was started on Risperdal and titrated to 2 mg BID, and given Klonopin 0.5 mg TID PRN for anxiety and insomnia. On this regimen patient's thought process cleared, and she was able to sleep. At time of discharge she notes an improvement in her depression, anxiety, and ability to think clearly. She denies any suicidal ideations, homicidal ideations, visual hallucinations, or auditory hallucinations. She plans to return home with her family and continue outpatient follow-up.   Physical Findings: AIMS: Facial and Oral Movements Muscles of Facial Expression: None, normal Lips and Perioral Area: None, normal Jaw: None, normal Tongue: None, normal,Extremity Movements Upper (arms, wrists, hands, fingers): None, normal Lower (legs, knees, ankles, toes): None, normal, Trunk Movements Neck, shoulders, hips: None, normal, Overall Severity Severity of abnormal movements (highest score from questions above): None, normal Incapacitation due to abnormal movements: None, normal Patient's awareness of abnormal movements (rate only patient's report): No Awareness, Dental Status Current problems with teeth and/or dentures?: No Does patient usually wear dentures?: No  CIWA:    COWS:     Musculoskeletal: Strength & Muscle Tone: within normal limits Gait & Station: normal Patient leans: N/A   Psychiatric Specialty Exam:  Presentation  General Appearance: Appropriate for Environment; Casual  Eye Contact:Good  Speech:Clear and Coherent  Speech Volume:Normal  Handedness:Right   Mood and Affect  Mood:Euthymic  Affect:Congruent; Full Range   Thought Process  Thought Processes:Goal Directed; Coherent  Descriptions of Associations:Intact  Orientation:Full  (Time, Place and Person)  Thought Content:Logical  History of Schizophrenia/Schizoaffective disorder:No  Duration of Psychotic Symptoms:Less than six months  Hallucinations:Hallucinations: None  Ideas of Reference:None  Suicidal Thoughts:Suicidal Thoughts: No  Homicidal Thoughts:Homicidal Thoughts: No   Sensorium  Memory:Immediate Good; Recent Good; Remote Good  Judgment:Intact  Insight:Present   Executive Functions  Concentration:Good  Attention Span:Good  Recall:Good  Fund of Knowledge:Good  Language:Good   Psychomotor Activity  Psychomotor Activity:Psychomotor Activity: Normal   Assets  Assets:Communication Skills; Desire for Improvement; Housing; Physical Health; Resilience; Social Support; Talents/Skills; Transportation; Vocational/Educational   Sleep  Sleep:Sleep: Good Number of Hours of Sleep: 7.5    Physical Exam: Physical Exam Vitals and nursing note reviewed.  Constitutional:      Appearance: Normal appearance.  HENT:     Head: Normocephalic and atraumatic.     Right Ear: External ear normal.     Left Ear: External ear normal.     Nose: Nose normal.     Mouth/Throat:     Mouth: Mucous membranes are dry.  Eyes:     Extraocular Movements: Extraocular movements intact.     Pupils: Pupils are equal, round, and reactive to light.  Cardiovascular:     Rate and Rhythm: Normal rate.     Pulses: Normal pulses.  Pulmonary:     Effort: Pulmonary effort is normal.     Breath sounds: Normal breath sounds.  Abdominal:     General: Abdomen is flat.     Palpations: Abdomen is soft.  Musculoskeletal:        General: No swelling. Normal range of motion.     Cervical back: Normal range of motion and neck supple.  Skin:    General: Skin is warm and dry.  Neurological:     General: No focal deficit present.     Mental Status: She is alert and oriented to person, place, and time.  Psychiatric:        Mood and Affect: Mood normal.         Behavior: Behavior normal.        Thought Content: Thought content normal.        Judgment: Judgment normal.   Review of Systems  Constitutional: Negative.   HENT: Negative.    Eyes: Negative.   Respiratory: Negative.    Cardiovascular: Negative.   Gastrointestinal: Negative.   Genitourinary: Negative.   Musculoskeletal: Negative.   Skin: Negative.   Neurological: Negative.   Endo/Heme/Allergies: Negative.   Psychiatric/Behavioral:  Negative for depression, hallucinations, memory loss, substance abuse and suicidal ideas. The patient is not nervous/anxious and does not have insomnia.   Blood pressure 114/67, pulse 74, temperature 97.9 F (36.6 C), temperature source Oral, resp. rate 18, height 5' (1.524 m), weight 50.3 kg, SpO2 100 %. Body mass index is 21.68 kg/m.   Social History   Tobacco Use  Smoking Status Never   Passive exposure: Never  Smokeless Tobacco Never   Tobacco Cessation:  N/A, patient does not currently use tobacco products   Blood Alcohol level:  Lab Results  Component Value Date   ETH <10 10/13/2020    Metabolic Disorder Labs:  Lab Results  Component Value Date   HGBA1C 5.2 10/15/2020   MPG 102.54 10/15/2020   No results found for: PROLACTIN Lab Results  Component Value Date  CHOL 143 10/15/2020   TRIG 100 10/15/2020   HDL 48 10/15/2020   CHOLHDL 3.0 10/15/2020   VLDL 20 10/15/2020   LDLCALC 75 10/15/2020    See Psychiatric Specialty Exam and Suicide Risk Assessment completed by Attending Physician prior to discharge.  Discharge destination:  Home  Is patient on multiple antipsychotic therapies at discharge:  No   Has Patient had three or more failed trials of antipsychotic monotherapy by history:  No  Recommended Plan for Multiple Antipsychotic Therapies: NA  Discharge Instructions     Diet general   Complete by: As directed    Increase activity slowly   Complete by: As directed       Allergies as of 10/19/2020   No Known  Allergies      Medication List     TAKE these medications      Indication  clonazePAM 0.5 MG tablet Commonly known as: KLONOPIN Take 1 tablet (0.5 mg total) by mouth 3 (three) times daily as needed (Anxiety or insomnia).  Indication: Feeling Anxious   risperiDONE 2 MG tablet Commonly known as: RISPERDAL Take 1 tablet (2 mg total) by mouth 2 (two) times daily.  Indication: Brief psyotic disorder        Follow-up Information     [facility] Follow up.   Why: Appointment is scheduled for 10/24/2020 at 12:30PM.  Thanks! Contact information: [address] [place] [address] [phone]                 Follow-up recommendations:  Activity:  as tolerated Diet:  regular diet  Comments:  30-day scripts with 1 refill sent to [facility] on [address] in [place], [place]. Diagnosis is still unclear and differential includes brief psychotic disorder versus bipolar I disorder with psychotic features. Recommend outpatient follow-up for continued clarification. If suicidal ideations return call 911 or proceed to the nearest emergency room.   Signed: Jesse Sans, MD 10/19/2020, 9:38 AM

## 2020-10-19 NOTE — Progress Notes (Signed)
Recreation Therapy Notes  INPATIENT RECREATION TR PLAN  Patient Details Name: Margrett Kalb MRN: [id number] DOB: 05-31-2000 Today's Date: 10/19/2020  Rec Therapy Plan Is patient appropriate for Therapeutic Recreation?: Yes Treatment times per week: at least 3 Estimated Length of Stay: 5-7 days TR Treatment/Interventions: Group participation (Comment)  Discharge Criteria Pt will be discharged from therapy if:: Discharged Treatment plan/goals/alternatives discussed and agreed upon by:: Patient/family  Discharge Summary Short term goals set: Patient will demonstrate improved communication skills by spontaneously contributing to 2 group discussions within 5 recreation therapy group sessions Short term goals met: Complete Progress toward goals comments: Groups attended Which groups?: Leisure education, AAA/T, Coping skills, Goal setting Reason goals not met: N/A Therapeutic equipment acquired: N/A Reason patient discharged from therapy: Discharge from hospital Pt/family agrees with progress & goals achieved: Yes Date patient discharged from therapy: 10/19/20   Damond Borchers 10/19/2020, 11:26 AM

## 2020-10-19 NOTE — Progress Notes (Signed)
Patient alert and oriented x 4.  Affect is pleasant and calm. Observed appropriate interactions with staff and peers. Denies SI/HI or AVH.  Will continue q15 minute rounding for safety checks per unit protocol.

## 2020-10-19 NOTE — Progress Notes (Signed)

## 2021-02-24 NOTE — L&D Delivery Note (Addendum)
Delivery Note  First Stage: Labor onset: 0830 Augmentation : cytotec, Cooke catheter, pitocin, AROM Analgesia /Anesthesia intrapartum: epidural AROM at 1849  Second Stage: Complete dilation at 2217 Onset of pushing at 2220 FHR second stage Category II tracing, prolonged fetal heart rate deceleration to the 90s x7 minutes and then to the 70s immediately prior to delivery  Delivery of a viable female infant 01/11/2022 at 2233 by Donato Schultz, CNM. delivery of fetal head in OA position with restitution to ROA. No nuchal cord;  Nuchal hand present. Anterior then posterior shoulders delivered easily with gentle downward traction. Baby placed on mom's chest, and attended to by peds.  Cord double clamped at 2 minutes of life per Peds request, cut by FOB Cord blood sample collected   Third Stage: Placenta delivered Tomasa Blase intact with 3 VC @ 2238 Placenta disposition: discarded Uterine tone firm / bleeding light  No laceration identified  Anesthesia for repair: n/a Repair none needed Est. Blood Loss (mL):  Complications: pre-eclampsia with severe features, preterm delivery  Mom to postpartum.  Baby to NICU.  Newborn: Birth Weight: 4lb 7oz Apgar Scores: 7, 8 Feeding planned: breast and bottle

## 2021-06-21 ENCOUNTER — Ambulatory Visit (LOCAL_COMMUNITY_HEALTH_CENTER): Payer: Self-pay

## 2021-06-21 VITALS — BP 110/63 | Ht 60.0 in | Wt 122.5 lb

## 2021-06-21 DIAGNOSIS — Z3201 Encounter for pregnancy test, result positive: Secondary | ICD-10-CM

## 2021-06-21 LAB — PREGNANCY, URINE: Preg Test, Ur: POSITIVE — AB

## 2021-06-21 MED ORDER — PRENATAL VITAMINS 28-0.8 MG PO TABS
28.0000 mg | ORAL_TABLET | Freq: Every day | ORAL | 0 refills | Status: AC
Start: 2021-06-21 — End: 2021-09-29

## 2021-06-21 MED ORDER — PRENATAL VITAMINS 28-0.8 MG PO TABS: 28.0000 mg | ORAL_TABLET | Freq: Every day | ORAL | 0 refills | Status: DC

## 2021-06-21 NOTE — Addendum Note (Signed)
Addended by: Henriette Combs B on: 06/21/2021 12:51 PM ? ? Modules accepted: Orders ? ?

## 2021-06-21 NOTE — Progress Notes (Addendum)
UPT positive.  Pt's mother accompanied her during today's visit.  Plans prenatal care at [facility].  To clerk after visit to schedule prenatal appt.   ? ?Pregnancy information packet given and reviewed with pt.  Gave card with contact info for Kathreen Cosier, social worker, if needed. ? ?Pt also asked if she could get appt. for physical needed for school;  provided a list of local resources.  ?Cherlynn Polo, RN  ?

## 2021-07-29 ENCOUNTER — Ambulatory Visit: Payer: Self-pay | Admitting: Nurse Practitioner

## 2021-07-29 ENCOUNTER — Encounter: Payer: Self-pay | Admitting: Nurse Practitioner

## 2021-07-29 VITALS — BP 106/63 | HR 80 | Temp 98.0°F | Wt 119.6 lb

## 2021-07-29 DIAGNOSIS — Z3481 Encounter for supervision of other normal pregnancy, first trimester: Secondary | ICD-10-CM

## 2021-07-29 DIAGNOSIS — Z3480 Encounter for supervision of other normal pregnancy, unspecified trimester: Secondary | ICD-10-CM

## 2021-07-29 LAB — URINALYSIS
Bilirubin, UA: NEGATIVE
Glucose, UA: NEGATIVE
Ketones, UA: NEGATIVE
Leukocytes,UA: NEGATIVE
Nitrite, UA: NEGATIVE
Protein,UA: NEGATIVE
Specific Gravity, UA: 1.025 (ref 1.005–1.030)
Urobilinogen, Ur: 0.2 mg/dL (ref 0.2–1.0)
pH, UA: 7 (ref 5.0–7.5)

## 2021-07-29 LAB — WET PREP FOR TRICH, YEAST, CLUE
Trichomonas Exam: NEGATIVE
Yeast Exam: NEGATIVE

## 2021-07-29 LAB — HEMOGLOBIN, FINGERSTICK: Hemoglobin: 11 g/dL — ABNORMAL LOW (ref 11.1–15.9)

## 2021-07-29 NOTE — Progress Notes (Signed)
Patient given Baby Aspirin handout (per provider)  with explanation. Instructed to purchase and start taking each day. Provider completed Weight gain Handout and given to patient. Patient verbalized understanding and will schedule return appointment in 4 weeks.

## 2021-07-29 NOTE — Progress Notes (Signed)
[facility]  Maternal Health Clinic   INITIAL PRENATAL VISIT NOTE  Subjective:  Gina Munoz is a 21 y.o. G1P0000 at 3963w6d being seen today to start prenatal care at the [facility].  She is currently monitored for the following issues for this low-risk pregnancy and has Brief psychotic disorder (HCC) on their problem list.  Patient reports no complaints.  Contractions: Not present. Vag. Bleeding: None.  Movement: Absent. Denies leaking of fluid.   Indications for ASA therapy (per uptodate) One of the following: Previous pregnancy with preeclampsia, especially early onset and with an adverse outcome No Multifetal gestation No Chronic hypertension No Type 1 or 2 diabetes mellitus No Chronic kidney disease No Autoimmune disease (antiphospholipid syndrome, systemic lupus erythematosus) No  Two or more of the following: Nulliparity Yes Obesity (body mass index >30 kg/m2) No Family history of preeclampsia in mother or sister No Age ?35 years No Sociodemographic characteristics (African American race, low socioeconomic level) Yes Personal risk factors (eg, previous pregnancy with low birth weight or small for gestational age infant, previous adverse pregnancy outcome [eg, stillbirth], interval >10 years between pregnancies) No   The following portions of the patient's history were reviewed and updated as appropriate: allergies, current medications, past family history, past medical history, past social history, past surgical history and problem list. Problem list updated.  Objective:   Vitals:   07/29/21 0836  BP: 106/63  Pulse: 80  Temp: 98 F (36.7 C)  Weight: 119 lb 9.6 oz (54.3 kg)    Fetal Status: Fetal Heart Rate (bpm): 160 Fundal Height: 11 cm Movement: Absent  Presentation: Undeterminable   Physical Exam Vitals and nursing note reviewed.  Constitutional:      General: She is not in acute distress.    Appearance: Normal  appearance. She is well-developed.  HENT:     Head: Normocephalic and atraumatic.     Right Ear: External ear normal.     Left Ear: External ear normal.     Nose: Nose normal. No congestion or rhinorrhea.     Mouth/Throat:     Lips: Pink.     Mouth: Mucous membranes are moist.     Dentition: Normal dentition. No dental caries.     Pharynx: Oropharynx is clear. Uvula midline.     Comments: Dentition: no visible signs of dental caries  Eyes:     General: No scleral icterus.    Conjunctiva/sclera: Conjunctivae normal.     Pupils: Pupils are equal, round, and reactive to light.  Neck:     Thyroid: No thyroid mass or thyromegaly.  Cardiovascular:     Rate and Rhythm: Normal rate and regular rhythm.     Pulses: Normal pulses.     Comments: Extremities are warm and well perfused Pulmonary:     Effort: Pulmonary effort is normal.     Breath sounds: Normal breath sounds.  Chest:     Chest wall: No mass.  Breasts:    Tanner Score is 5.     Breasts are symmetrical.     Right: Normal. No mass, nipple discharge or skin change.     Left: Normal. No mass, nipple discharge or skin change.     Comments: Breasts:        Right: Normal. No swelling, mass, nipple discharge, skin change or tenderness.        Left: Normal. No swelling, mass, nipple discharge, skin change or tenderness.   Abdominal:     General:  Abdomen is flat.     Palpations: Abdomen is soft.     Tenderness: There is no abdominal tenderness.     Comments: Gravid   Genitourinary:    General: Normal vulva.     Exam position: Lithotomy position.     Pubic Area: No rash.      Labia:        Right: No rash.        Left: No rash.      Vagina: Normal. No vaginal discharge.     Cervix: No cervical motion tenderness or friability.     Uterus: Normal. Enlarged (Gravid 11 size). Not tender.      Adnexa: Right adnexa normal and left adnexa normal.     Rectum: Normal. No external hemorrhoid.     Comments: External genitalia/pubic  area without nits, lice, edema, erythema, lesions and inguinal adenopathy. Vagina with normal mucosa and discharge. Cervix without visible lesions. Uterus firm, mobile, nt, no masses, no CMT, no adnexal tenderness or fullness. pH 4.5. Musculoskeletal:     Cervical back: Full passive range of motion without pain, normal range of motion and neck supple.     Right lower leg: No edema.     Left lower leg: No edema.  Lymphadenopathy:     Cervical: No cervical adenopathy.     Upper Body:     Right upper body: No axillary adenopathy.     Left upper body: No axillary adenopathy.  Skin:    General: Skin is warm and dry.     Capillary Refill: Capillary refill takes less than 2 seconds.  Neurological:     Mental Status: She is alert and oriented to person, place, and time.  Psychiatric:        Attention and Perception: Attention normal.        Mood and Affect: Mood normal.        Speech: Speech normal.        Behavior: Behavior normal. Behavior is cooperative.    Assessment and Plan:  Pregnancy: G1P0000 at [redacted]w[redacted]d  1. Supervision of other normal pregnancy, antepartum -21 year old female in clinic for initial prenatal visit.  -Patient has no complaints today and declines any recent ED visits. -Patient also given resources sheet regarding recommended weight gain and ASA for prevention of preeclampsia.    -Patient states she is feeling good about the pregnancy -FOB is present.   -Patient is currently working at [employer]  and lives with family members.   -Last LMP 05/07/21, will send patient for initial U/S to confirm dates.   -PAP performed today.  -Last dental exam was on 3/23.  Reviewed with patient that dental exams are safe and recommended during pregnancy.   -Denies drug use, agrees to UDS today. -Declines genetic counseling.  Agrees to MaterniT21 testing today.  -COVID vaccine history x 2 .  Offerred Flu vaccine, declines today.  -PHQ-9= 1 -Hgb= 11.0   - HIV Antibody  (routine testing w rflx) - RPR - Prenatal profile without Varicella/Rubella (767209) - HCV Ab w Reflex to Quant PCR - Urine Culture - Chlamydia/GC NAA, Confirmation - Hemoglobinopathy evaluation -[id number] - WET PREP FOR TRICH, YEAST, CLUE - Urinalysis (Urine Dip) - Hemoglobin, venipuncture - [id number] Drug Screen - IGP, rfx Aptima HPV ASCU - MaterniT21 PLUS Core+ESS+SCA   Discussed overview of care and coordination with inpatient delivery practices including [facility], [facility], [facility] and [facility].     Term labor symptoms and general obstetric precautions including  but not limited to vaginal bleeding, contractions, leaking of fluid and fetal movement were reviewed in detail with the patient.  Please refer to After Visit Summary for other counseling recommendations.   Return in about 4 weeks (around 08/26/2021) for Routine prenatal care visit.   Glenna Fellows, FNP

## 2021-07-30 ENCOUNTER — Telehealth: Payer: Self-pay

## 2021-07-30 LAB — 789231 7+OXYCODONE-BUND
Amphetamines, Urine: NEGATIVE ng/mL
BENZODIAZ UR QL: NEGATIVE ng/mL
Barbiturate screen, urine: NEGATIVE ng/mL
Cannabinoid Quant, Ur: NEGATIVE ng/mL
Cocaine (Metab.): NEGATIVE ng/mL
OPIATE SCREEN URINE: NEGATIVE ng/mL
Oxycodone/Oxymorphone, Urine: NEGATIVE ng/mL
PCP Quant, Ur: NEGATIVE ng/mL

## 2021-07-30 LAB — CBC/D/PLT+RPR+RH+ABO+AB SCR
Antibody Screen: NEGATIVE
Basophils Absolute: 0 10*3/uL (ref 0.0–0.2)
Basos: 1 %
EOS (ABSOLUTE): 0.1 10*3/uL (ref 0.0–0.4)
Eos: 1 %
Hematocrit: 31.9 % — ABNORMAL LOW (ref 34.0–46.6)
Hemoglobin: 10.8 g/dL — ABNORMAL LOW (ref 11.1–15.9)
Hepatitis B Surface Ag: NEGATIVE
Immature Grans (Abs): 0 10*3/uL (ref 0.0–0.1)
Immature Granulocytes: 0 %
Lymphocytes Absolute: 1.3 10*3/uL (ref 0.7–3.1)
Lymphs: 22 %
MCH: 28.4 pg (ref 26.6–33.0)
MCHC: 33.9 g/dL (ref 31.5–35.7)
MCV: 84 fL (ref 79–97)
Monocytes Absolute: 0.5 10*3/uL (ref 0.1–0.9)
Monocytes: 9 %
Neutrophils Absolute: 3.9 10*3/uL (ref 1.4–7.0)
Neutrophils: 67 %
Platelets: 208 10*3/uL (ref 150–450)
RBC: 3.8 x10E6/uL (ref 3.77–5.28)
RDW: 12.1 % (ref 11.7–15.4)
RPR Ser Ql: NONREACTIVE
Rh Factor: POSITIVE
WBC: 5.8 10*3/uL (ref 3.4–10.8)

## 2021-07-30 LAB — HCV INTERPRETATION

## 2021-07-30 LAB — HIV ANTIBODY (ROUTINE TESTING W REFLEX): HIV Screen 4th Generation wRfx: NONREACTIVE

## 2021-07-30 LAB — HCV AB W REFLEX TO QUANT PCR: HCV Ab: NONREACTIVE

## 2021-07-30 NOTE — Telephone Encounter (Signed)
TC to patient to inform of [facility] U/S on 08/07/21. Counseled to arrive 10:45 with full bladder. Patient states understanding and denies questions at this time.Burt Knack, RN

## 2021-07-31 DIAGNOSIS — Z3480 Encounter for supervision of other normal pregnancy, unspecified trimester: Secondary | ICD-10-CM | POA: Insufficient documentation

## 2021-07-31 LAB — HGB FRACTIONATION CASCADE
Hgb A2: 2.6 % (ref 1.8–3.2)
Hgb A: 97.4 % (ref 96.4–98.8)
Hgb F: 0 % (ref 0.0–2.0)
Hgb S: 0 %

## 2021-07-31 LAB — URINE CULTURE

## 2021-08-02 LAB — CHLAMYDIA/GC NAA, CONFIRMATION
Chlamydia trachomatis, NAA: NEGATIVE
Neisseria gonorrhoeae, NAA: NEGATIVE

## 2021-08-03 LAB — MATERNIT21  PLUS CORE+ESS+SCA, BLOOD

## 2021-08-06 ENCOUNTER — Telehealth: Payer: Self-pay | Admitting: Family Medicine

## 2021-08-06 NOTE — Telephone Encounter (Signed)
Patient rescheduled her U/S appointment for 08-09-21 at 3:30 pm. Hart Carwin, RN

## 2021-08-06 NOTE — Telephone Encounter (Signed)
Pt states that she has been trying to reach the maternity clinic regarding an appointment that was scheduled for an ultrasound at [facility]. The appointment is tomorrow and pt states that she is unable to go at that time. Pt is unsure if she can call [facility] to reschedule the appointment or if someone from the [facility] has to call.

## 2021-08-06 NOTE — Telephone Encounter (Signed)
Return call back to patient regarding her [facility] U/S appointment 08-07-21 at 11 am.  Patient reports she cannot make that appointment tomorrow.  I explained to her I called [facility] and they said she could call them directly at [phone] to reschedule her appointment.  Patient is to call back to let me know her new U/S day/time. Dahlia Bailiff, RN

## 2021-08-07 ENCOUNTER — Ambulatory Visit: Payer: Self-pay

## 2021-08-09 ENCOUNTER — Ambulatory Visit
Admission: RE | Admit: 2021-08-09 | Discharge: 2021-08-09 | Disposition: A | Discharge status: Home / Self Care | Payer: Medicaid Other | Source: Ambulatory Visit | Attending: Nurse Practitioner | Admitting: Nurse Practitioner

## 2021-08-09 DIAGNOSIS — Z3480 Encounter for supervision of other normal pregnancy, unspecified trimester: Secondary | ICD-10-CM

## 2021-08-09 DIAGNOSIS — Z3482 Encounter for supervision of other normal pregnancy, second trimester: Secondary | ICD-10-CM | POA: Insufficient documentation

## 2021-08-12 LAB — IGP, RFX APTIMA HPV ASCU: PAP Smear Comment: 0

## 2021-08-12 LAB — HPV APTIMA: HPV Aptima: NEGATIVE

## 2021-08-15 ENCOUNTER — Encounter: Payer: Self-pay | Admitting: Nurse Practitioner

## 2021-08-15 DIAGNOSIS — R87610 Atypical squamous cells of undetermined significance on cytologic smear of cervix (ASC-US): Secondary | ICD-10-CM | POA: Insufficient documentation

## 2021-08-15 HISTORY — DX: Atypical squamous cells of undetermined significance on cytologic smear of cervix (ASC-US): R87.610

## 2021-08-15 NOTE — Addendum Note (Signed)
Addended by: Heywood Bene on: 08/15/2021 01:55 PM   Modules accepted: Orders

## 2021-08-23 ENCOUNTER — Encounter: Payer: Self-pay | Admitting: Physician Assistant

## 2021-08-23 ENCOUNTER — Ambulatory Visit: Payer: Self-pay | Admitting: Physician Assistant

## 2021-08-23 VITALS — BP 90/58 | HR 76 | Temp 97.8°F | Wt 119.8 lb

## 2021-08-23 DIAGNOSIS — Z3480 Encounter for supervision of other normal pregnancy, unspecified trimester: Secondary | ICD-10-CM

## 2021-08-23 DIAGNOSIS — O99019 Anemia complicating pregnancy, unspecified trimester: Secondary | ICD-10-CM

## 2021-08-23 HISTORY — DX: Anemia complicating pregnancy, unspecified trimester: O99.019

## 2021-08-23 MED ORDER — FERROUS SULFATE 325 (65 FE) MG PO TABS
325.0000 mg | ORAL_TABLET | Freq: Every day | ORAL | 3 refills | Status: DC
Start: 2021-08-23 — End: 2021-12-13

## 2021-08-23 NOTE — Progress Notes (Signed)
[facility]  PRENATAL VISIT NOTE  Subjective:  Gina Munoz is a 21 y.o. G1P0000 at [redacted]w[redacted]d being seen today for ongoing prenatal care.  She is currently monitored for the following issues for this low-risk pregnancy and has Brief psychotic disorder (HCC); Supervision of other normal pregnancy, antepartum; ASCUS of cervix with negative high risk HPV 07/29/21; and Anemia affecting pregnancy, antepartum on their problem list.  Patient reports no complaints.  Contractions: Not present. Vag. Bleeding: None.  Movement: Absent. Denies leaking of fluid/ROM.   The following portions of the patient's history were reviewed and updated as appropriate: allergies, current medications, past family history, past medical history, past social history, past surgical history and problem list. Problem list updated.  Objective:   Vitals:   08/23/21 0902  BP: (!) 90/58  Pulse: 76  Temp: 97.8 F (36.6 C)  Weight: 119 lb 12.8 oz (54.3 kg)    Fetal Status: Fetal Heart Rate (bpm): 164 Fundal Height: 14 cm Movement: Absent     General:  Alert, oriented and cooperative. Patient is in no acute distress.  Skin: Skin is warm and dry. No rash noted.   Cardiovascular: Normal heart rate noted  Respiratory: Normal respiratory effort, no problems with respiration noted  Abdomen: Soft, gravid, appropriate for gestational age.  Pain/Pressure: Absent     Pelvic: Cervical exam deferred        Extremities: Normal range of motion.     Mental Status: Normal mood and affect. Normal behavior. Normal judgment and thought content.   Assessment and Plan:  Pregnancy: G1P0000 at [redacted]w[redacted]d  1. Supervision of other normal pregnancy, antepartum Enc po hydration in light of very hot weather. Continue daily low-dose aspirin. Schedule fetal anat [place] at 19-20 wk. Mat 21 Plus result was neg, AFP only today. - AFP, Serum, Open Spina Bifida  2. Anemia affecting pregnancy, antepartum Review  of chart shows hgb was 10.8 at prior visit. Anemia panel and start oral iron supplement today. - Fe+CBC/D/Plt+TIBC+Fer+Retic - ferrous sulfate 325 (65 FE) MG tablet; Take 1 tablet (325 mg total) by mouth daily with breakfast.  Dispense: 100 tablet; Refill: 3  Preterm labor symptoms and general obstetric precautions including but not limited to vaginal bleeding, contractions, leaking of fluid and fetal movement were reviewed in detail with the patient. Please refer to After Visit Summary for other counseling recommendations.  Return in about 4 weeks (around 09/20/2021) for Routine prenatal care.  Future Appointments  Date Time Provider Department Center  09/20/2021  9:00 AM [facility] PROVIDER [facility] None    Landry Dyke, PA-C

## 2021-08-25 LAB — AFP, SERUM, OPEN SPINA BIFIDA
AFP MoM: 1.63
AFP Value: 60.1 ng/mL
Gest. Age on Collection Date: 15.3 weeks
Maternal Age At EDD: 23.4 yr
OSBR Risk 1 IN: 1944
Test Results:: NEGATIVE
Weight: 119 [lb_av]

## 2021-08-25 LAB — FE+CBC/D/PLT+TIBC+FER+RETIC
Basophils Absolute: 0 10*3/uL (ref 0.0–0.2)
Basos: 1 %
EOS (ABSOLUTE): 0.1 10*3/uL (ref 0.0–0.4)
Eos: 1 %
Ferritin: 93 ng/mL (ref 15–150)
Hematocrit: 31.8 % — ABNORMAL LOW (ref 34.0–46.6)
Hemoglobin: 10.8 g/dL — ABNORMAL LOW (ref 11.1–15.9)
Immature Grans (Abs): 0 10*3/uL (ref 0.0–0.1)
Immature Granulocytes: 1 %
Iron Saturation: 29 % (ref 15–55)
Iron: 100 ug/dL (ref 27–159)
Lymphocytes Absolute: 1.3 10*3/uL (ref 0.7–3.1)
Lymphs: 21 %
MCH: 28.4 pg (ref 26.6–33.0)
MCHC: 34 g/dL (ref 31.5–35.7)
MCV: 84 fL (ref 79–97)
Monocytes Absolute: 0.5 10*3/uL (ref 0.1–0.9)
Monocytes: 8 %
Neutrophils Absolute: 4.3 10*3/uL (ref 1.4–7.0)
Neutrophils: 68 %
Platelets: 193 10*3/uL (ref 150–450)
RBC: 3.8 x10E6/uL (ref 3.77–5.28)
RDW: 12.4 % (ref 11.7–15.4)
Retic Ct Pct: 2.3 % (ref 0.6–2.6)
Total Iron Binding Capacity: 341 ug/dL (ref 250–450)
UIBC: 241 ug/dL (ref 131–425)
WBC: 6.3 10*3/uL (ref 3.4–10.8)

## 2021-08-30 ENCOUNTER — Telehealth: Payer: Self-pay

## 2021-08-30 NOTE — Telephone Encounter (Signed)
Call to [facility] to ascertain if anatomy US has been scheduled. Per receptionist, [place] appt has been scheduled for 09/17/2021 at 1015. Jossie Ng, RN

## 2021-09-05 NOTE — Addendum Note (Signed)
Addended by: Heywood Bene on: 09/05/2021 12:19 PM   Modules accepted: Orders

## 2021-09-20 ENCOUNTER — Ambulatory Visit: Payer: Self-pay | Admitting: Advanced Practice Midwife

## 2021-09-20 VITALS — BP 102/57 | HR 83 | Temp 97.7°F | Wt 118.2 lb

## 2021-09-20 DIAGNOSIS — Z3480 Encounter for supervision of other normal pregnancy, unspecified trimester: Secondary | ICD-10-CM

## 2021-09-20 DIAGNOSIS — O99019 Anemia complicating pregnancy, unspecified trimester: Secondary | ICD-10-CM

## 2021-09-20 DIAGNOSIS — R87610 Atypical squamous cells of undetermined significance on cytologic smear of cervix (ASC-US): Secondary | ICD-10-CM

## 2021-09-20 DIAGNOSIS — F23 Brief psychotic disorder: Secondary | ICD-10-CM

## 2021-09-20 NOTE — Progress Notes (Signed)
Behavioral consent signed and witnessed during clinic visit. Hazeline Junker, LCSW card given to patient and handed her Amanda's professional disclosure statement.   Earlyne Iba, RN

## 2021-09-20 NOTE — Progress Notes (Addendum)
[facility] Health [facility]  PRENATAL VISIT NOTE  Subjective:  Gina Munoz is a 21 y.o. G1P0000 at [redacted]w[redacted]d being seen today for ongoing prenatal care.  She is currently monitored for the following issues for this low-risk pregnancy and has Brief psychotic disorder (HCC) 09/2020; Supervision of other normal pregnancy, antepartum; ASCUS of cervix with negative high risk HPV 07/29/21; and Anemia affecting pregnancy, antepartum on their problem list.  Patient reports no complaints.  Contractions: Not present. Vag. Bleeding: None.  Movement: Present. Denies leaking of fluid/ROM.   The following portions of the patient's history were reviewed and updated as appropriate: allergies, current medications, past family history, past medical history, past social history, past surgical history and problem list. Problem list updated.  Objective:   Vitals:   09/20/21 0840  BP: (!) 102/57  Pulse: 83  Temp: 97.7 F (36.5 C)  Weight: 118 lb 3.2 oz (53.6 kg)    Fetal Status: Fetal Heart Rate (bpm): 140 Fundal Height: 20 cm Movement: Present     General:  Alert, oriented and cooperative. Patient is in no acute distress.  Skin: Skin is warm and dry. No rash noted.   Cardiovascular: Normal heart rate noted  Respiratory: Normal respiratory effort, no problems with respiration noted  Abdomen: Soft, gravid, appropriate for gestational age.  Pain/Pressure: Absent     Pelvic: Cervical exam deferred        Extremities: Normal range of motion.  Edema: None  Mental Status: Normal mood and affect. Normal behavior. Normal judgment and thought content.   Assessment and Plan:  Pregnancy: G1P0000 at [redacted]w[redacted]d  1. 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 ASCUS of cervix with negative high risk HPV 07/29/21 Repeat pap in 3 years  3. Brief psychotic disorder (HCC) 09/2020 Pt admits to arguing a lot with FOB and no contact from him this week; pt teary Poor sleep, appetite wnl, +cry daily, +moody, irritable, easily angered, energy level up and down, -SI/HI. Pt agrees to call Kathreen Cosier, LCSW Hx psych admission 09/2020 x1 week [facility] with Risperadol and Klonizapam x few months  - Ambulatory referral to Behavioral Health 4. Anemia Taking FeSo4 I daily with water seperately from vits  Preterm labor symptoms and general obstetric precautions including but not limited to vaginal bleeding, contractions, leaking of fluid and fetal movement were reviewed in detail with the patient. Please refer to After Visit Summary for other counseling recommendations.  Return in about 4 weeks (around 10/18/2021) for routine PNC.  Future Appointments  Date Time Provider Department Center  10/18/2021  8:40 AM [facility] PROVIDER [facility] None    Alberteen Spindle, CNM

## 2021-09-25 NOTE — Progress Notes (Signed)
Presented to [facility] requesting PNV. The patient was dispensed PNV today. I provided counseling today regarding the medication. We discussed the medication, the side effects and when to call clinic. Patient given the opportunity to ask questions. Questions answered. PNV 1 bottle:  Lot # [id number], Expiration date 12/2022. B. Venetia Constable RN

## 2021-10-07 ENCOUNTER — Ambulatory Visit: Payer: Medicaid Other

## 2021-10-07 DIAGNOSIS — Z7185 Encounter for immunization safety counseling: Secondary | ICD-10-CM

## 2021-10-07 NOTE — Progress Notes (Signed)
Patient seen today in nurse clinic wanting a Tdap.  She starts college this week and the college has requirement for Tdap. Last Tdap was 09/18/2008.  Patient is 21 weeks 6 days pregnant.  Dr. Alvester Morin was contacted and recommended we send a letter stating the CDC guidelines for Tdap during the third trimester and to delay the Tdap vaccine until patient reaches 27-[redacted] weeks gestation.  Patient is in agreement.  Letter provided to patient.

## 2021-10-16 ENCOUNTER — Ambulatory Visit: Payer: Medicaid Other | Admitting: Licensed Clinical Social Worker

## 2021-10-16 DIAGNOSIS — F4322 Adjustment disorder with anxiety: Secondary | ICD-10-CM

## 2021-10-16 NOTE — Progress Notes (Unsigned)
Counselor Initial Adult Exam  Name: Gina Munoz Date: 10/17/2021 MRN: [id number] DOB: September 16, 2000 PCP: Pcp, No  Time spent: 1 hour   A biopsychosocial was completed on the Patient. Background information and current concerns were obtained during an intake in the office with the [facility] Department clinician, Milton Ferguson, LCSW.  Reviewed profession disclosure, contact information and confidentiality was discussed and appropriate consents were signed.    Reason for Visit /Presenting Problem: Patient reports that she was recommended for therapy because she has been going through relationship conflict with her boyfriend. She reports worrying a lot about providing for the baby together. Patient shares that they have been together for one year and their first baby is due February 11, 2022. Patient also reports worries about a previous hospitalization she had 09/2020 where she was diagnosed with Brief Psychotic Disorder. She reports that at that time she was hospitalized for one week due to inability to sleep, auditory hallucinations and seeing things. She reports that she followed up one time with a therapist at [facility] but never returned. She shares she took medication for about 3 months and reports no issues with the medication, but self discontinued. Patient reports that she has had more anxiety since that experience because it was scary for her, she also reports experiencing anxiety related to relationship problems with boyfriend and about parenting. Patient shares that overall her sleep has been good, but did have a few nights where she struggled to fall asleep due to distress/worries about related to her boyfriend and him cheating on her. Patient currently denies any depressive symptoms.      10/16/2021    2:18 PM 07/29/2021    9:05 AM 06/21/2021    9:21 AM  Depression screen PHQ 2/9  Decreased Interest 1 0 1  Down, Depressed, Hopeless 0 0 1  PHQ - 2 Score 1 0 2  Altered sleeping    0  Tired, decreased energy   0  Change in appetite   0  Feeling bad or failure about yourself    0  Trouble concentrating   1  Moving slowly or fidgety/restless   0  Suicidal thoughts   0  PHQ-9 Score   3      10/16/2021    2:19 PM  GAD 7 : Generalized Anxiety Score  Nervous, Anxious, on Edge 1  Control/stop worrying 3  Worry too much - different things 3  Trouble relaxing 1  Restless 0  Easily annoyed or irritable 2  Afraid - awful might happen 1  Total GAD 7 Score 11  Anxiety Difficulty Somewhat difficult   The Mood Disorder Questionnaire  This instrument is designed for screening purposes only and is not to be used as a diagnostic tool.  RESULTS: Question 1:  4 out of 13  Question 2: No Question 3: Minor problem  Patient reports yes or no to the following:  Has there ever been a period of time when you were not your usual self AND....  .....you felt so good or so hyper that other people thought you were not your normal self or you were so hyper that you got into trouble? Yes  .Marland Kitchen...you were so irritable that you shouted at people or started fights or arguments? Yes  .Marland Kitchen...you felt much more self-confident than usual?  Yes  .Marland Kitchen...you got much less sleep than usual and found you didn't really miss it? No  .....you were much more talkative or spoke faster than usual?  No  ..Marland Kitchen  thoughts raced through your head and you couldn't slow your mind down?  No  .....you were so easily distracted by things around you that you had trouble concentrating or staying on track?  No  .....you had much more energy than usual?   No  .....you were much more active or did many more things than usual? Yes  .Marland Kitchen...you were much more social or outgoing than usual, for example, you telephoned friends in the middle of the night?  Yes  .Marland Kitchen...you were much more interested in sex than usual?  No  ....you did things that were unusual for you or that other people might have thought were excessive,  foolish, or risky?  Yes  .Marland KitchenMarland Kitchen..spending money got you or your family in trouble?  No  2.  If you said YES to more than one of the above, have several of these ever happened during the same period of time?  Please check 1 response only.  No  3.  How much of a problem did any of these cause you - like being able to work; having family, money, or legal troubles; getting into arguments or fights? Please choose 1 response only:  No problem, Minor Problem, Moderate Problem or Serious Problem. Minor problem   4.  Have any of your blood relatives (ie. Children, siblings, parents, grandparents, aunts, uncles) had manic-depressive illness or bipolar disorder? No  5.  Has a health professional ever told you that you have manic-depressive illness or bipolar disorder. No  Positive Screen - All 3 of the following criteria must be met: Question 1:  7 out of 13 positive (yes) responses Question 2:  Positive (yes) response Question 3:  "Moderate" or "Serious" problem  Adapted from Humphrey Rolls, Spitzer RL, et al. Development and validation of a screening instrument for bipolar spectrumdisorder: the Mood Disorder Questionnaire. Am J Psychiatry. 6433;295:1884-1660.  Mental Status Exam:    Appearance:   Casual and Well Groomed     Behavior:  Appropriate and Sharing  Motor:  Normal  Speech/Language:   Clear and Coherent and Normal Rate  Affect:  Appropriate, Congruent, and Full Range  Mood:  normal  Thought process:  normal  Thought content:    WNL  Sensory/Perceptual disturbances:    WNL  Orientation:  oriented to person, place, time/date, situation, and day of week  Attention:  Good  Concentration:  Fair  Memory:  [place] of knowledge:   Good  Insight:    Fair  Judgment:   Fair  Impulse Control:  Fair   Reported Symptoms:   Anxiety, worries, mild sleep disturbance   Risk Assessment: Danger to Self:  No Self-injurious Behavior: No Danger to Others: No Duty to Warn:no Physical  Aggression / Violence:No  Access to Firearms a concern: No  Gang Involvement:No  Patient / guardian was educated about steps to take if suicide or homicide risk level increases between visits: yes While future psychiatric events cannot be accurately predicted, the patient does not currently require acute inpatient psychiatric care and does not currently meet [facility] involuntary commitment criteria.  Substance Abuse History: Current substance abuse: No     Past Psychiatric History:   Previous psychological history is significant for Brief psychotic Episode  No family history  Outpatient Providers:NA History of Psych Hospitalization: Yes   Abuse History: Victim of No.,  Report needed: No. Victim of Neglect:No. Perpetrator of  No   Witness / Exposure to Domestic Violence: No   Protective Services Involvement:  No  Witness to [employer] Violence:  No   Family History:  Family History  Problem Relation Age of Onset   Healthy Mother    High Cholesterol Father    Healthy Sister    Healthy Brother    Healthy Maternal Grandmother    Healthy Paternal Grandfather     Social History:  Social History   Socioeconomic History   Marital status: Single    Spouse name: FOB- Cesar   Number of children: 0   Years of education: 12   Highest education level: 12th grade  Occupational History   Occupation: Waitress  Tobacco Use   Smoking status: Never    Passive exposure: Never   Smokeless tobacco: Former    Quit date: 05/25/2021  Vaping Use   Vaping Use: Former   Quit date: 05/25/2021   Substances: Nicotine, Flavoring  Substance and Sexual Activity   Alcohol use: Not Currently    Alcohol/week: 1.0 standard drink of alcohol    Types: 1 Glasses of wine per week    Comment: socially/last before pregant, every 3 weeks or so   Drug use: Never   Sexual activity: Yes    Partners: Male    Comment: yesterday last sex without condom use  Other Topics Concern   Not on file  Social  History Narrative   ** Merged History Encounter **       Social Determinants of Health   Financial Resource Strain: Low Risk  (07/29/2021)   Overall Financial Resource Strain (CARDIA)    Difficulty of Paying Living Expenses: Not very hard  Food Insecurity: No Food Insecurity (07/29/2021)   Hunger Vital Sign    Worried About Running Out of Food in the Last Year: Never true    [employer] in the Last Year: Never true  Transportation Needs: No Transportation Needs (07/29/2021)   PRAPARE - Hydrologist (Medical): No    Lack of Transportation (Non-Medical): No  Physical Activity: Not on file  Stress: No Stress Concern Present (07/29/2021)   [place]    Feeling of Stress : Not at all  Social Connections: Unknown (07/29/2021)   Social Connection and Isolation Panel [NHANES]    Frequency of Communication with Friends and Family: Once a week    Frequency of Social Gatherings with Friends and Family: Not on file    Attends Religious Services: Not on Advertising copywriter or Organizations: Not on file    Attends Archivist Meetings: Not on file    Marital Status: Not on file    Living situation: the patient lives with her parents and her two siblings live in the home   Sexual Orientation:  Straight  Relationship Status: Dating   Name of spouse / other: NA             If a parent, number of children / ages: Currently pregnant   Support Systems; friends, parents  Financial Stress:  Yes no concerns about shelter or meeting her basic needs   Income/Employment/Disability: Employment part-time   Armed forces logistics/support/administrative officer: No   Educational History: Education:  Currently in school for medical assistant   Religion/Sprituality/World View:    Believe in God   Any cultural differences that may affect / interfere with treatment:  not applicable   Recreation/Hobbies: Use to enjoy the gym    Stressors:Other: want to finish school and have a healthy  baby     Strengths:  Supportive Relationships, Family, Friends, and Able to Communicate Effectively  Barriers:  none notes   Legal History: Pending legal issue / charges: The patient has no significant history of legal issues. History of legal issue / charges:  Was charged with misdemeanor underage drinking   Medical History/Surgical History:reviewed Past Medical History:  Diagnosis Date   Anemia affecting pregnancy, antepartum 08/23/2021   ASCUS of cervix with negative high risk HPV 07/29/21 08/15/2021   Mental disorder     No past surgical history on file.  Medications: Current Outpatient Medications  Medication Sig Dispense Refill   aspirin EC 81 MG tablet Take 81 mg by mouth daily. Swallow whole.     ferrous sulfate 325 (65 FE) MG tablet Take 1 tablet (325 mg total) by mouth daily with breakfast. 100 tablet 3   Omega-3 Fatty Acids (OMEGA 3 PO) Take 830 mg by mouth daily. Nordic Naturals Prenatal DHA. Taking 2 pills daily.     No current facility-administered medications for this visit.   No Known Allergies  Arshia Spellman is a 21 y.o. year old female with a history of mental health diagnoses of Brief psychotic disorder. Patient currently presents with mild anxiety symptoms that she reports are due to relationship stressors with boyfriend.  Patient currently describes adjustment disorder with anxiety, including feeling anxious, difficulties controlling worries, worrying about a variety of things, difficulties relating, and mild sleep disturbance. (GAD-7 = 11). Patient denies any current depressive symptoms, hallucinations, delusions or psychotic thought patterns.  Patient reports that these symptoms impact her functioning in multiple life domains.   Due to the above symptoms and patient's reported history, patient is diagnosed with Adjustment Disorder, with anxiety. Patient's symptoms should continue to be monitored  closely to provide further diagnosis clarification. Continued mental health treatment is needed to address patient's symptoms and monitor her safety and stability. Patient is recommended for psychiatric medication management evaluation and continued outpatient therapy to further reduce her symptoms and improve her coping strategies.    There is no acute risk for suicide or violence at this time.  While future psychiatric events cannot be accurately predicted, the patient does not require acute inpatient psychiatric care and does not currently meet [facility] involuntary commitment criteria.    Diagnoses:    ICD-10-CM   1. Adjustment disorder with anxious mood  F43.22      Plan of Care:  Patient's goal of treatment is to learn to process and get different points of view and to not be stuck in her head all the time. She also would like to have someone to talk to and decrease anger towards her partner (yelling).   -LCSW provided brief psychoeducation and a rational for use of CBT's.  -LCSW and patient agreed to develop a treatment plan at next session.    -LCSW encouraged patient to think about or consider being evaluated by [facility] Mood Disorder clinic to seek their recommendations regarding medication management.   Future Appointments  Date Time Provider [place]  10/18/2021  8:40 AM AC-[facility] PROVIDER [facility] None  10/30/2021  2:00 PM Milton Ferguson, LCSW AC[facility] None    Milton Ferguson, LCSW

## 2021-10-17 DIAGNOSIS — F4322 Adjustment disorder with anxiety: Secondary | ICD-10-CM | POA: Insufficient documentation

## 2021-10-18 ENCOUNTER — Ambulatory Visit: Payer: Medicaid Other | Admitting: Advanced Practice Midwife

## 2021-10-18 VITALS — BP 101/69 | HR 81 | Temp 97.4°F | Wt 121.0 lb

## 2021-10-18 DIAGNOSIS — R87610 Atypical squamous cells of undetermined significance on cytologic smear of cervix (ASC-US): Secondary | ICD-10-CM

## 2021-10-18 DIAGNOSIS — O99019 Anemia complicating pregnancy, unspecified trimester: Secondary | ICD-10-CM

## 2021-10-18 DIAGNOSIS — O99012 Anemia complicating pregnancy, second trimester: Secondary | ICD-10-CM

## 2021-10-18 DIAGNOSIS — Z3480 Encounter for supervision of other normal pregnancy, unspecified trimester: Secondary | ICD-10-CM

## 2021-10-18 DIAGNOSIS — Z3482 Encounter for supervision of other normal pregnancy, second trimester: Secondary | ICD-10-CM

## 2021-10-18 DIAGNOSIS — F4322 Adjustment disorder with anxiety: Secondary | ICD-10-CM

## 2021-10-18 LAB — HEMOGLOBIN, FINGERSTICK: Hemoglobin: 9.7 g/dL — ABNORMAL LOW (ref 11.1–15.9)

## 2021-10-18 NOTE — Progress Notes (Signed)
Patient here for MH RV at 21 3/7. CMHRP and PHQ9 today. S/S PTL reviewed and literature given. Needs Hgb check today.Burt Knack, RN

## 2021-10-18 NOTE — Progress Notes (Signed)
[facility] Department Maternal Health Clinic  PRENATAL VISIT NOTE  Subjective:  Gina Munoz is a 21 y.o. G1P0000 at [redacted]w[redacted]d being seen today for ongoing prenatal care.  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  Patient reports no complaints.   .  .  Movement: Present. Denies leaking of fluid/ROM.   The following portions of the patient's history were reviewed and updated as appropriate: allergies, current medications, past family history, past medical history, past social history, past surgical history and problem list. Problem list updated.  Objective:   Vitals:   10/18/21 0858  BP: 101/69  Pulse: 81  Temp: (!) 97.4 F (36.3 C)  Weight: 121 lb (54.9 kg)    Fetal Status: Fetal Heart Rate (bpm): 145 Fundal Height: 22 cm Movement: Present     General:  Alert, oriented and cooperative. Patient is in no acute distress.  Skin: Skin is warm and dry. No rash noted.   Cardiovascular: Normal heart rate noted  Respiratory: Normal respiratory effort, no problems with respiration noted  Abdomen: Soft, gravid, appropriate for gestational age.        Pelvic: Cervical exam deferred        Extremities: Normal range of motion.  Edema: None  Mental Status: Normal mood and affect. Normal behavior. Normal judgment and thought content.   Assessment and Plan:  Pregnancy: G1P0000 at [redacted]w[redacted]d  1. Anemia affecting pregnancy, antepartum 1. Anemia affecting pregnancy, antepartum Has been taking FeSo4 I daily with juice or water--to take BID with juice - Hemoglobin, fingerstick  2. 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 ASCUS of cervix with negative high risk HPV 07/29/21 Repeat 07/2024  4. Adjustment disorder with anxious mood Had apt with Kathreen Cosier, LCSW 10/16/21; next apt 10/30/21 Feels things are better with FOB  - Hemoglobin, fingerstick   Preterm labor symptoms and general obstetric precautions including but not limited to vaginal bleeding, contractions, leaking of fluid and fetal movement were reviewed in detail with the patient. Please refer to After Visit Summary for other counseling recommendations.  Return in about 4 weeks (around 11/15/2021) for 28 week labs.  Future Appointments  Date Time Provider Department Center  10/30/2021  2:00 PM Kathreen Cosier, LCSW [facility] None    Alberteen Spindle, CNM

## 2021-10-18 NOTE — Progress Notes (Signed)
Hgb = 9.7 today. Patient counseled to increase her iron to 2x/day with vitamin C rich juice. Patient counseled on prevention/management of constipation.Burt Knack, RN

## 2021-10-30 ENCOUNTER — Ambulatory Visit: Payer: 59 | Admitting: Licensed Clinical Social Worker

## 2021-10-30 NOTE — Progress Notes (Unsigned)
Counselor/Therapist Progress Note  Patient ID: Gina Munoz, MRN: [id number],    Date: 10/30/2021  Time Spent: ***   Treatment Type: Psychotherapy  Reported Symptoms: {CHL AMB Reported Symptoms:[phone]}  Mental Status Exam:  Appearance:   {PSY:22683}     Behavior:  {PSY:21022743}  Motor:  {PSY:22302}  Speech/Language:   {PSY:22685}  Affect:  {PSY:22687}  Mood:  {PSY:31886}  Thought process:  {PSY:31888}  Thought content:    {[phone]}  Sensory/Perceptual disturbances:    {[phone]}  Orientation:  {PSY:30297}  Attention:  {PSY:22877}  Concentration:  {PSY:636 554 1064}  Memory:  {[phone]}  Fund of knowledge:   {PSY:636 554 1064}  Insight:    {PSY:636 554 1064}  Judgment:   {PSY:636 554 1064}  Impulse Control:  {PSY:636 554 1064}   Risk Assessment: Danger to Self:  No Self-injurious Behavior: No Danger to Others: No Duty to Warn:no Physical Aggression / Violence:No  Access to Firearms a concern: No  Gang Involvement:No   Subjective: Patient was engaged and cooperative throughout the session using time effectively to discuss   Patient voices continued motivation for treatment and understanding of  . Patient is likely to benefit from future treatment because  remains motivated to decrease  And   and reports benefit of regular sessions in addressing these symptoms.     Interventions: {PSY:[phone]}  Checked in with patient and reviewed previous session, including assessment and goal of treatment. Reviewed CBTs. Explored patient's goal of treatment and worked collaboratively to develop CBT treatment plan. Provided support through active listening, validation of feelings, and highlighted patient's strengths.    Diagnosis:No diagnosis found.  Plan: Patient's goal of treatment is to learn to process and get different points of view and to not be stuck in her head all the time. She also would like to have someone to talk to and decrease anger towards her  partner (yelling).     Future Appointments  Date Time Provider Department Center  10/30/2021  2:00 PM Kathreen Cosier, [place] [facility] None  11/15/2021  8:40 AM [facility] PROVIDER [facility] None    Kathreen Cosier, LCSW

## 2021-11-11 ENCOUNTER — Encounter: Payer: Self-pay | Admitting: Family Medicine

## 2021-11-11 NOTE — Progress Notes (Unsigned)
Patient ID: Gina Munoz, female   DOB: 2000-12-31, 21 y.o.   MRN: [id number] [place] Conference Date: 11/11/21  [name] was identified by clinical staff to benefit from an interdisciplinary team approach to help improve pregnancy care.  The [place] includes the maternity clinic coordinator (RN), medical providers (MD/APP staff), Care Management -OBCM and [facility], Centering Pregnancy coordinator, Infant Mortality reduction Geophysical data processor.  Nursing staff are also encouraged to participate. The group meets monthly to discuss patient care and coordinate services.   The patient's care care at the agency was reviewed in EMR and high risk factors evaluated in an interdisciplinary approach.    Value added interventions discussed at this care conference today were:   [facility] Meeting 11/07/21 Pt. Has seen therapist AM but missed latest appt. Has great support system  Pt. Has concerns of previous hospitalization that occurred last year Concerned to happen again.  Lives with both parents  Mood check for PP L.[name]

## 2021-11-15 ENCOUNTER — Ambulatory Visit: Payer: Medicaid Other | Admitting: Advanced Practice Midwife

## 2021-11-15 VITALS — BP 95/58 | HR 81 | Temp 97.8°F | Wt 128.6 lb

## 2021-11-15 DIAGNOSIS — Z3480 Encounter for supervision of other normal pregnancy, unspecified trimester: Secondary | ICD-10-CM

## 2021-11-15 DIAGNOSIS — R87610 Atypical squamous cells of undetermined significance on cytologic smear of cervix (ASC-US): Secondary | ICD-10-CM

## 2021-11-15 DIAGNOSIS — O99019 Anemia complicating pregnancy, unspecified trimester: Secondary | ICD-10-CM

## 2021-11-15 DIAGNOSIS — F4322 Adjustment disorder with anxiety: Secondary | ICD-10-CM

## 2021-11-15 DIAGNOSIS — O99013 Anemia complicating pregnancy, third trimester: Secondary | ICD-10-CM

## 2021-11-15 DIAGNOSIS — Z23 Encounter for immunization: Secondary | ICD-10-CM

## 2021-11-15 DIAGNOSIS — Z3483 Encounter for supervision of other normal pregnancy, third trimester: Secondary | ICD-10-CM

## 2021-11-15 LAB — HEMOGLOBIN, FINGERSTICK: Hemoglobin: 10.6 g/dL — ABNORMAL LOW (ref 11.1–15.9)

## 2021-11-15 NOTE — Progress Notes (Signed)
[place] Department Maternal Health Clinic  PRENATAL VISIT NOTE  Subjective:  Gina Munoz is a 21 y.o. G1P0000 at [redacted]w[redacted]d being seen today for ongoing prenatal care.  She is currently monitored for the following issues for this low-risk pregnancy and has Brief psychotic disorder ([place]) 09/2020; Supervision of other normal pregnancy, antepartum; ASCUS of cervix with negative high risk HPV 07/29/21; Anemia affecting pregnancy, antepartum; and Adjustment disorder with anxious mood on their problem list.  Patient reports no complaints.  Contractions: Not present. Vag. Bleeding: None.  Movement: Present. Denies leaking of fluid/ROM.   The following portions of the patient's history were reviewed and updated as appropriate: allergies, current medications, past family history, past medical history, past social history, past surgical history and problem list. Problem list updated.  Objective:   Vitals:   11/15/21 0847  BP: (!) 95/58  Pulse: 81  Temp: 97.8 F (36.6 C)  Weight: 128 lb 9.6 oz (58.3 kg)    Fetal Status: Fetal Heart Rate (bpm): 150 Fundal Height: 26 cm Movement: Present     General:  Alert, oriented and cooperative. Patient is in no acute distress.  Skin: Skin is warm and dry. No rash noted.   Cardiovascular: Normal heart rate noted  Respiratory: Normal respiratory effort, no problems with respiration noted  Abdomen: Soft, gravid, appropriate for gestational age.  Pain/Pressure: Absent     Pelvic: Cervical exam deferred        Extremities: Normal range of motion.  Edema: None  Mental Status: Normal mood and affect. Normal behavior. Normal judgment and thought content.   Assessment and Plan:  Pregnancy: G1P0000 at [redacted]w[redacted]d  1. 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 Anemia affecting pregnancy, antepartum Taking FeSo4 BID with water or juice  3. Adjustment disorder with anxious mood Had 1 apt with Milton Ferguson, LCSW and [facility] 10/30/21 apt  4. ASCUS of cervix with negative high risk HPV 07/29/21 Repeat pap 07/2024   Preterm labor symptoms and general obstetric precautions including but not limited to vaginal bleeding, contractions, leaking of fluid and fetal movement were reviewed in detail with the patient. Please refer to After Visit Summary for other counseling recommendations.  Return in about 2 weeks (around 11/29/2021) for routine PNC.  No future appointments.  Herbie Saxon, CNM

## 2021-11-15 NOTE — Progress Notes (Signed)
Hgb reviewed during clinic visit - patient to continue taking iron daily.   Al Decant, RN

## 2021-11-16 LAB — HIV-1/HIV-2 QUALITATIVE RNA
HIV-1 RNA, Qualitative: NONREACTIVE
HIV-2 RNA, Qualitative: NONREACTIVE

## 2021-11-16 LAB — RPR: RPR Ser Ql: NONREACTIVE

## 2021-11-16 LAB — GLUCOSE, 1 HOUR GESTATIONAL: Gestational Diabetes Screen: 87 mg/dL (ref 70–139)

## 2021-11-27 NOTE — Addendum Note (Signed)
Addended by: Cletis Media on: 11/27/2021 12:46 PM   Modules accepted: Orders

## 2021-11-29 ENCOUNTER — Ambulatory Visit: Payer: Medicaid Other | Admitting: Advanced Practice Midwife

## 2021-11-29 VITALS — BP 115/67 | HR 77 | Temp 98.1°F | Wt 128.6 lb

## 2021-11-29 DIAGNOSIS — R87610 Atypical squamous cells of undetermined significance on cytologic smear of cervix (ASC-US): Secondary | ICD-10-CM

## 2021-11-29 DIAGNOSIS — O99019 Anemia complicating pregnancy, unspecified trimester: Secondary | ICD-10-CM

## 2021-11-29 DIAGNOSIS — F23 Brief psychotic disorder: Secondary | ICD-10-CM

## 2021-11-29 DIAGNOSIS — Z3480 Encounter for supervision of other normal pregnancy, unspecified trimester: Secondary | ICD-10-CM

## 2021-11-29 NOTE — Progress Notes (Addendum)
[place] [facility]  PRENATAL VISIT NOTE  Subjective:  Gina Munoz is a 21 y.o. G1P0000 at [redacted]w[redacted]d being seen today for ongoing prenatal care.  She is currently monitored for the following issues for this low-risk pregnancy and has Brief psychotic disorder ([place]) 09/2020; Supervision of other normal pregnancy, antepartum; ASCUS of cervix with negative high risk HPV 07/29/21; Anemia affecting pregnancy, antepartum; and Adjustment disorder with anxious mood on their problem list.  Patient reports no complaints.  Contractions: Not present. Vag. Bleeding: None.  Movement: Present. Denies leaking of fluid/ROM.   The following portions of the patient's history were reviewed and updated as appropriate: allergies, current medications, past family history, past medical history, past social history, past surgical history and problem list. Problem list updated.  Objective:   Vitals:   11/29/21 0846  BP: 115/67  Pulse: 77  Temp: 98.1 F (36.7 C)  Weight: 128 lb 9.6 oz (58.3 kg)    Fetal Status: Fetal Heart Rate (bpm): 160 Fundal Height: 27 cm Movement: Present     General:  Alert, oriented and cooperative. Patient is in no acute distress.  Skin: Skin is warm and dry. No rash noted.   Cardiovascular: Normal heart rate noted  Respiratory: Normal respiratory effort, no problems with respiration noted  Abdomen: Soft, gravid, appropriate for gestational age.  Pain/Pressure: Absent     Pelvic: Cervical exam deferred        Extremities: Normal range of motion.  Edema: None  Mental Status: Normal mood and affect. Normal behavior. Normal judgment and thought content.   Assessment and Plan:  Pregnancy: G1P0000 at [redacted]w[redacted]d  1. Brief psychotic disorder ([place]) 09/2020 Had apt with Milton Ferguson 10/16/21 and [facility] 10/30/21 apt Pt says she will reschedule Does cardio machine 1-2x/wk x 1 hour Reviewed 09/17/21 u/s at 19.0 wks with normal anatomy, AFI wnl, 3VC, EFW=42%,  anterior placenta  2. Anemia affecting pregnancy, antepartum Taking FeSo4 BID with juice  3. 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 ASCUS of cervix with negative high risk HPV 07/29/21 Repeat pap 07/2024   Preterm labor symptoms and general obstetric precautions including but not limited to vaginal bleeding, contractions, leaking of fluid and fetal movement were reviewed in detail with the patient. Please refer to After Visit Summary for other counseling recommendations.  Return in about 2 weeks (around 12/13/2021) for routine PNC.  No future appointments.  Herbie Saxon, CNM

## 2021-11-29 NOTE — Progress Notes (Signed)
Correctly verbalizes how to take iron and prenatal vitamin. Taking iron tablet with vitamin C beverage. Rich Number, RN

## 2021-12-11 ENCOUNTER — Ambulatory Visit: Payer: Medicaid Other | Admitting: Licensed Clinical Social Worker

## 2021-12-11 DIAGNOSIS — F4322 Adjustment disorder with anxiety: Secondary | ICD-10-CM

## 2021-12-11 NOTE — Progress Notes (Signed)
Counselor/Therapist Progress Note  Patient ID: Gina Munoz, MRN: [id number],    Date: 12/11/2021  Time Spent: 45 minutes   Treatment Type: Psychotherapy  Reported Symptoms:  Denies depressive symptoms; anxiety, anxious thoughts   Mental Status Exam:  Appearance:   Casual and Neat     Behavior:  Appropriate and Sharing  Motor:  Normal  Speech/Language:   Clear and Coherent and Normal Rate  Affect:  Appropriate, Congruent, and Full Range  Mood:  normal  Thought process:  normal  Thought content:    WNL  Sensory/Perceptual disturbances:    WNL  Orientation:  oriented to person, place, time/date, and situation  Attention:  Good  Concentration:  Good  Memory:  WNL  Fund of knowledge:   Good  Insight:    Fair  Judgment:   Fair  Impulse Control:  Fair   Risk Assessment: Danger to Self:  No Self-injurious Behavior: No Danger to Others: No Duty to Warn:no Physical Aggression / Violence:No  Access to Firearms a concern: No  Gang Involvement:No   Subjective: Patient was engaged and cooperative throughout the session using time effectively to discuss thoughts and feelings. She voices continued motivation for treatment and understanding of CBTs.   Interventions: Cognitive Behavioral Therapy Checked in with patient about her mood and current psychosocial stressors and mood symptoms. Engaged patient in reviewing previous session regarding assessment and treatment plan. Engaged patient in processing thoughts and feelings related to stressors in relationship with boyfriend. Taught patient about CBTs and engaged her in reviewing goal of treatment and developing a treatment plan. Provided support through active listening, validation of feelings, and highlighted patient's strengths.    Diagnosis:   ICD-10-CM   1. Adjustment disorder with anxious mood  F43.22      Plan: Patient's goal of treatment is to learn to process and get different points of view and to not be stuck in her  head all the time. She also would like to have someone to talk to and decrease anger towards her partner (yelling).   Treatment Target: Understand the relationship between thoughts, emotions, and behaviors  Psychoeducation on CBTs model   Oriented the client to the therapeutic approach Teach the connection between thoughts, emotions, and behaviors   Treatment Target: Increase realistic balanced thinking -to learn how to replace thinking with thoughts that are more accurate or helpful Explore patient's thoughts, beliefs, automatic thoughts, assumptions  Identify and replace unhelpful thinking patterns (upsetting ideas, self-talk and mental images) Process distress and allow for emotional release  Questioning and challenging thoughts Cognitive reappraisal  Restructuring, Socratic questioning   Treatment Target: Increase emotional regulation  Coping skills to manage anxiety, including mindfulness, grounding techniques, intentional breathing    Teach distress tolerance techniques - "what helps me"  Communication skills and boundaries   Future Appointments  Date Time Provider [place]  12/13/2021  8:40 AM [facility] PROVIDER [facility] None  12/18/2021  3:00 PM Milton Ferguson, LCSW [facility] None    Milton Ferguson, LCSW

## 2021-12-13 ENCOUNTER — Ambulatory Visit: Payer: Medicaid Other | Admitting: Nurse Practitioner

## 2021-12-13 VITALS — BP 123/63 | HR 74 | Temp 97.7°F | Wt 132.2 lb

## 2021-12-13 DIAGNOSIS — Z3480 Encounter for supervision of other normal pregnancy, unspecified trimester: Secondary | ICD-10-CM

## 2021-12-13 DIAGNOSIS — O99019 Anemia complicating pregnancy, unspecified trimester: Secondary | ICD-10-CM

## 2021-12-13 DIAGNOSIS — F23 Brief psychotic disorder: Secondary | ICD-10-CM

## 2021-12-13 DIAGNOSIS — F4322 Adjustment disorder with anxiety: Secondary | ICD-10-CM

## 2021-12-13 LAB — HEMOGLOBIN, FINGERSTICK: Hemoglobin: 11.3 g/dL (ref 11.1–15.9)

## 2021-12-13 MED ORDER — FERROUS SULFATE 325 (65 FE) MG PO TABS: 325.0000 mg | ORAL_TABLET | Freq: Every day | ORAL | 3 refills | Status: AC

## 2021-12-13 NOTE — Progress Notes (Signed)
[place] [facility]  PRENATAL VISIT NOTE  Subjective:  Gina Munoz is a 21 y.o. G1P0000 at [redacted]w[redacted]d being seen today for ongoing prenatal care.  She is currently monitored for the following issues for this low-risk pregnancy and has Brief psychotic disorder ([place]) 09/2020; Supervision of other normal pregnancy, antepartum; ASCUS of cervix with negative high risk HPV 07/29/21; Anemia affecting pregnancy, antepartum; and Adjustment disorder with anxious mood on their problem list.  Patient reports no complaints.  Contractions: Not present. Vag. Bleeding: None.  Movement: Present. Denies leaking of fluid/ROM.   The following portions of the patient's history were reviewed and updated as appropriate: allergies, current medications, past family history, past medical history, past social history, past surgical history and problem list. Problem list updated.  Objective:   Vitals:   12/13/21 0838  BP: 123/63  Pulse: 74  Temp: 97.7 F (36.5 C)  Weight: 132 lb 3.2 oz (60 kg)    Fetal Status: Fetal Heart Rate (bpm): 150 Fundal Height: 31 cm Movement: Present     General:  Alert, oriented and cooperative. Patient is in no acute distress.  Skin: Skin is warm and dry. No rash noted.   Cardiovascular: Normal heart rate noted  Respiratory: Normal respiratory effort, no problems with respiration noted  Abdomen: Soft, gravid, appropriate for gestational age.  Pain/Pressure: Absent     Pelvic: Cervical exam deferred        Extremities: Normal range of motion.  Edema: None  Mental Status: Normal mood and affect. Normal behavior. Normal judgment and thought content.   Assessment and Plan:  Pregnancy: G1P0000 at [redacted]w[redacted]d  1. Anemia affecting pregnancy, antepartum -Patient taking Iron daily with juice separate from PNV.  Encouraged to eat iron rich foods.   -Hgb- 11.3 today, will continue to monitor.  - Hemoglobin, fingerstick - ferrous sulfate 325 (65 FE) MG  tablet; Take 1 tablet (325 mg total) by mouth daily with breakfast.  Dispense: 100 tablet; Refill: 3 total) by mouth daily with breakfast.  Dispense: 100 tablet; Refill: 3  2. Supervision of other normal pregnancy, antepartum -21 year old female in clinic today for prenatal care. -Patient reports taking PNV daily. -ROS reviewed, no complaints.  -Kick counts reviewed with patient.  -16 lb 3.2 oz (7.348 kg)   3. Brief psychotic disorder ([place]) 09/2020 -Patient continue visits with Milton Ferguson, LCSW.  Overall doing well.    4. Adjustment disorder with anxious mood -Patient continue visits with Milton Ferguson, LCSW.  Overall doing well.      Term labor symptoms and general obstetric precautions including but not limited to vaginal bleeding, contractions, leaking of fluid and fetal movement were reviewed in detail with the patient. Please refer to After Visit Summary for other counseling recommendations.   Return in about 2 weeks (around 12/27/2021) for Routine prenatal care visit.  Future Appointments  Date Time Provider [place]  12/18/2021  3:00 PM Milton Ferguson, LCSW [facility] None  12/27/2021  8:40 AM [facility] PROVIDER [facility] None    Gregary Cromer, FNP

## 2021-12-13 NOTE — Progress Notes (Signed)
Patient here for MH RV at [redacted]w[redacted]d.   Patient requested refill for iron. She states she ran out recently. Iron dispensed to patient and patient also due for hgb recheck.   Al Decant, RN

## 2021-12-18 ENCOUNTER — Ambulatory Visit: Payer: Medicaid Other | Admitting: Licensed Clinical Social Worker

## 2021-12-18 DIAGNOSIS — F4322 Adjustment disorder with anxiety: Secondary | ICD-10-CM

## 2021-12-18 NOTE — Progress Notes (Signed)
Counselor/Therapist Progress Note  Patient ID: Gina Munoz, MRN: [id number],    Date: 12/18/2021  Time Spent: 45 minutes   Treatment Type: Psychotherapy  Reported Symptoms:  mild anxiety  Mental Status Exam:  Appearance:   Neat and Well Groomed     Behavior:  Appropriate, Sharing, and responsive   Motor:  Normal  Speech/Language:   Clear and Coherent and Normal Rate  Affect:  Appropriate, Congruent, and Full Range  Mood:  normal  Thought process:  normal  Thought content:    WNL  Sensory/Perceptual disturbances:    WNL  Orientation:  oriented to person, place, time/date, and situation  Attention:  Good  Concentration:  Good  Memory:  WNL  Fund of knowledge:   Good  Insight:    Fair  Judgment:   Fair  Impulse Control:  Fair   Risk Assessment: Danger to Self:  No Self-injurious Behavior: No Danger to Others: No Duty to Warn:no Physical Aggression / Violence:No  Access to Firearms a concern: No  Gang Involvement:No   Subjective: Patient was engaged and cooperative throughout the session using time effectively to discuss thoughts and feelings. Patient was receptive to feedback and intervention from LCSW. Patient voices continued motivation for treatment.   Interventions: Cognitive Behavioral Therapy and client centered  Checked in with patient regarding her week. Engaged patient in processing thoughts and emotions related to challenges in relationship regarding living arrangements, leading to anxiety/anxious thoughts. Validated patient's worries, identifying origins of these feelings. LCSW taught patient communication strategies, including "I messages" and discussed a tool she can use to identify her conflict style/ and use with boyfriend to discuss. Provided support through active listening, validation of feelings, and highlighted patient's strengths.    Diagnosis:   ICD-10-CM   1. Adjustment disorder with anxious mood  F43.22       Plan: Patient's goal of  treatment is to learn to process and get different points of view and to not be stuck in her head all the time. She also would like to have someone to talk to and decrease anger towards her partner (yelling).    Treatment Target: Understand the relationship between thoughts, emotions, and behaviors  Psychoeducation on CBTs model   Oriented the client to the therapeutic approach Teach the connection between thoughts, emotions, and behaviors    Treatment Target: Increase realistic balanced thinking -to learn how to replace thinking with thoughts that are more accurate or helpful Explore patient's thoughts, beliefs, automatic thoughts, assumptions  Identify and replace unhelpful thinking patterns (upsetting ideas, self-talk and mental images) Process distress and allow for emotional release  Questioning and challenging thoughts Cognitive reappraisal  Restructuring, Socratic questioning    Treatment Target: Increase emotional regulation  Coping skills to manage anxiety, including mindfulness, grounding techniques, intentional breathing    Teach distress tolerance techniques - "what helps me"  Communication skills and boundaries   Future Appointments  Date Time Provider [place]  12/25/2021  3:00 PM Milton Ferguson, LCSW [facility] None  12/27/2021  8:40 AM [facility] PROVIDER [facility] None   Milton Ferguson, LCSW

## 2021-12-25 ENCOUNTER — Ambulatory Visit: Payer: Medicaid Other | Admitting: Licensed Clinical Social Worker

## 2021-12-25 DIAGNOSIS — F4322 Adjustment disorder with anxiety: Secondary | ICD-10-CM

## 2021-12-25 NOTE — Progress Notes (Signed)
Counselor/Therapist Progress Note  Patient ID: Gina Munoz, MRN: [id number],    Date: 12/25/2021  Time Spent: 37 minutes   Treatment Type: Psychotherapy  Reported Symptoms:  mild anxiety, anxious thoughts   Mental Status Exam:  Appearance:   Casual, Neat, and Well Groomed     Behavior:  Appropriate and Sharing  Motor:  Normal  Speech/Language:   Clear and Coherent and Normal Rate  Affect:  Appropriate, Congruent, and Full Range  Mood:  normal  Thought process:  normal  Thought content:    WNL  Sensory/Perceptual disturbances:    WNL  Orientation:  oriented to person, place, time/date, and situation  Attention:  Good  Concentration:  Good  Memory:  WNL  Fund of knowledge:   Good  Insight:    Fair  Judgment:   Fair  Impulse Control:  Fair   Risk Assessment: Danger to Self:  No Self-injurious Behavior: No Danger to Others: No Duty to Warn:no Physical Aggression / Violence:No  Access to Firearms a concern: No  Gang Involvement:No   Subjective: Patient was engaged and cooperative throughout the session using time effectively to discuss thoughts and feelings related to relationship challenges and actively participated in mindfulness exercise.  Patient was receptive to feedback and intervention from LCSW. Patient voices continued motivation for treatment and agrees to practice mindfulness meditations.      Interventions: Cognitive Behavioral Therapy and Client centered Checked in with patient about her week. Reviewed previous session regarding relationship stressors, decrease in anxiety around living situation.  Provided Psychoeducation on mindfulness, engaged patient in mindfulness exercise, processed exercise, and contracted with patient to complete daily.    Diagnosis:   ICD-10-CM   1. Adjustment disorder with anxious mood  F43.22      Plan: Patient's goal of treatment is to learn to process and get different points of view and to not be stuck in her head all the  time. She also would like to have someone to talk to and decrease anger towards her partner (yelling).    Treatment Target: Understand the relationship between thoughts, emotions, and behaviors  Psychoeducation on CBTs model   Oriented the client to the therapeutic approach Teach the connection between thoughts, emotions, and behaviors    Treatment Target: Increase realistic balanced thinking -to learn how to replace thinking with thoughts that are more accurate or helpful Explore patient's thoughts, beliefs, automatic thoughts, assumptions  Identify and replace unhelpful thinking patterns (upsetting ideas, self-talk and mental images) Process distress and allow for emotional release  Questioning and challenging thoughts Cognitive reappraisal  Restructuring, Socratic questioning    Treatment Target: Increase emotional regulation  Coping skills to manage anxiety, including mindfulness, grounding techniques, intentional breathing    Teach distress tolerance techniques - "what helps me"  Communication skills and boundaries   Future Appointments  Date Time Provider [place]  12/27/2021  8:40 AM [facility] PROVIDER [facility] None  01/01/2022  3:00 PM Milton Ferguson, LCSW [facility] None   Milton Ferguson, LCSW

## 2021-12-27 ENCOUNTER — Encounter: Payer: Self-pay | Admitting: Nurse Practitioner

## 2021-12-27 ENCOUNTER — Ambulatory Visit: Payer: Medicaid Other | Admitting: Nurse Practitioner

## 2021-12-27 VITALS — BP 124/79 | HR 77 | Temp 77.0°F | Wt 132.6 lb

## 2021-12-27 DIAGNOSIS — O99013 Anemia complicating pregnancy, third trimester: Secondary | ICD-10-CM

## 2021-12-27 DIAGNOSIS — Z3483 Encounter for supervision of other normal pregnancy, third trimester: Secondary | ICD-10-CM

## 2021-12-27 DIAGNOSIS — F4322 Adjustment disorder with anxiety: Secondary | ICD-10-CM

## 2021-12-27 DIAGNOSIS — Z3480 Encounter for supervision of other normal pregnancy, unspecified trimester: Secondary | ICD-10-CM

## 2021-12-27 DIAGNOSIS — O99019 Anemia complicating pregnancy, unspecified trimester: Secondary | ICD-10-CM

## 2021-12-27 DIAGNOSIS — F23 Brief psychotic disorder: Secondary | ICD-10-CM

## 2021-12-27 NOTE — Progress Notes (Signed)
[place] [facility]  PRENATAL VISIT NOTE  Subjective:  Gina Munoz is a 21 y.o. G1P0000 at [redacted]w[redacted]d being seen today for ongoing prenatal care.  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  Patient reports  pain in growing area .  Contractions: Not present.  .  Movement: Present. Denies leaking of fluid/ROM.   The following portions of the patient's history were reviewed and updated as appropriate: allergies, current medications, past family history, past medical history, past social history, past surgical history and problem list. Problem list updated.  Objective:   Vitals:   12/27/21 0828  BP: 124/79  Pulse: 77  Temp: (!) 77 F (25 C)  Weight: 132 lb 9.6 oz (60.1 kg)    Fetal Status: Fetal Heart Rate (bpm): 140 Fundal Height: 31 cm Movement: Present     General:  Alert, oriented and cooperative. Patient is in no acute distress.  Skin: Skin is warm and dry. No rash noted.   Cardiovascular: Normal heart rate noted  Respiratory: Normal respiratory effort, no problems with respiration noted  Abdomen: Soft, gravid, appropriate for gestational age.  Pain/Pressure: Present     Pelvic: Cervical exam deferred        Extremities: Normal range of motion.  Edema: Trace  Mental Status: Normal mood and affect. Normal behavior. Normal judgment and thought content.   Assessment and Plan:  Pregnancy: G1P0000 at [redacted]w[redacted]d  1. Supervision of other normal pregnancy, antepartum 21 year old female in clinic today for prenatal care. -ROS reviewed, patient with complaints of pain in her groin area.  Recommended frequent stretching exercises.   -Patient reports taking  PNV. -Continues to monitor kick counts.  -16 lb 9.6 oz (7.53 kg)   2. Anemia affecting pregnancy, antepartum -Patient reports taking Iron daily.  Encouraged to continue with Iron rich foods and taking Iron separate from PNV with juice. -Next Hgb check 01/10/22  3. 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  Patient reports she is doing well.  4. Adjustment disorder with anxious mood -Patient to continue visits with Milton Ferguson, LCSW.  Patient reports she is doing well.   Term labor symptoms and general obstetric precautions including but not limited to vaginal bleeding, contractions, leaking of fluid and fetal movement were reviewed in detail with the patient. Please refer to After Visit Summary for other counseling recommendations.   Return in about 2 weeks (around 01/10/2022) for Routine prenatal care visit.  Future Appointments  Date Time Provider [place]  01/01/2022  3:00 PM Milton Ferguson, LCSW [facility] None  01/10/2022  2:20 PM [facility] PROVIDER [facility] None    Gregary Cromer, FNP

## 2021-12-27 NOTE — Progress Notes (Signed)
Performing kick counts since last visit. BThiele RN

## 2022-01-01 ENCOUNTER — Ambulatory Visit: Payer: Medicaid Other | Admitting: Licensed Clinical Social Worker

## 2022-01-01 NOTE — Progress Notes (Unsigned)
Counselor/Therapist Progress Note  Patient ID: Gina Munoz, MRN: [id number],    Date: 01/01/2022  Time Spent: ***   Treatment Type: Psychotherapy  Reported Symptoms: {CHL AMB Reported Symptoms:[id number]}  Mental Status Exam:  Appearance:   {PSY:22683}     Behavior:  {PSY:21022743}  Motor:  {PSY:22302}  Speech/Language:   {PSY:22685}  Affect:  {PSY:22687}  Mood:  {PSY:31886}  Thought process:  {PSY:31888}  Thought content:    {[phone]}  Sensory/Perceptual disturbances:    {[phone]}  Orientation:  {PSY:30297}  Attention:  {PSY:22877}  Concentration:  {PSY:239-616-8309}  Memory:  {PSY:667-221-6859}  Fund of knowledge:   {PSY:239-616-8309}  Insight:    {PSY:239-616-8309}  Judgment:   {PSY:239-616-8309}  Impulse Control:  {PSY:239-616-8309}   Risk Assessment: Danger to Self:  No Self-injurious Behavior: No Danger to Others: No Duty to Warn:no Physical Aggression / Violence:No  Access to Firearms a concern: No  Gang Involvement:No   Subjective: Patient was engaged and cooperative throughout the session using time effectively to discuss    . Patient was receptive to feedback and intervention from LCSW. Patient voices continued motivation for treatment and understanding of  . Patient is likely to benefit from future treatment because they remain motivated to decrease  and   and reports benefit of regular sessions.        Interventions: {PSY:[phone]} Checked in with patient regarding their week. LCSW assisted patient in processing their emotions about what they have experienced in ... and ... with / at .... LCSW reviewed behavior modification, distress tolerance and effective communication skills with patient. Provided support through active listening, validation of feelings, and highlighted patient's strengths.     Diagnosis:   ICD-10-CM   1. Adjustment disorder with anxious mood  F43.22      Plan: Patient's goal of treatment is to learn to process and get  different points of view and to not be stuck in her head all the time. She also would like to have someone to talk to and decrease anger towards her partner (yelling).    Treatment Target: Understand the relationship between thoughts, emotions, and behaviors  Psychoeducation on CBTs model   Oriented the client to the therapeutic approach Teach the connection between thoughts, emotions, and behaviors    Treatment Target: Increase realistic balanced thinking -to learn how to replace thinking with thoughts that are more accurate or helpful Explore patient's thoughts, beliefs, automatic thoughts, assumptions  Identify and replace unhelpful thinking patterns (upsetting ideas, self-talk and mental images) Process distress and allow for emotional release  Questioning and challenging thoughts Cognitive reappraisal  Restructuring, Socratic questioning    Treatment Target: Increase emotional regulation  Coping skills to manage anxiety, including mindfulness, grounding techniques, intentional breathing    Teach distress tolerance techniques - "what helps me"  Communication skills and boundaries   Future Appointments  Date Time Provider Department [facility]  01/01/2022  3:00 PM Kathreen Cosier, LCSW [facility] None  01/10/2022  2:20 PM [facility] PROVIDER [facility] None   Kathreen Cosier, LCSW

## 2022-01-10 ENCOUNTER — Other Ambulatory Visit: Payer: Self-pay

## 2022-01-10 ENCOUNTER — Encounter: Payer: Self-pay | Admitting: Obstetrics and Gynecology

## 2022-01-10 ENCOUNTER — Inpatient Hospital Stay
Admission: EM | Admit: 2022-01-10 | Discharge: 2022-01-15 | DRG: 806 | Disposition: A | Discharge status: Home / Self Care | Payer: Medicaid Other

## 2022-01-10 ENCOUNTER — Observation Stay: Payer: Medicaid Other

## 2022-01-10 ENCOUNTER — Other Ambulatory Visit: Payer: Self-pay | Admitting: Obstetrics

## 2022-01-10 ENCOUNTER — Ambulatory Visit: Payer: Medicaid Other | Admitting: Advanced Practice Midwife

## 2022-01-10 VITALS — BP 139/94 | HR 83 | Temp 97.5°F | Wt 137.0 lb

## 2022-01-10 DIAGNOSIS — O1414 Severe pre-eclampsia complicating childbirth: Secondary | ICD-10-CM | POA: Diagnosis present

## 2022-01-10 DIAGNOSIS — D62 Acute posthemorrhagic anemia: Secondary | ICD-10-CM | POA: Diagnosis not present

## 2022-01-10 DIAGNOSIS — Z23 Encounter for immunization: Secondary | ICD-10-CM

## 2022-01-10 DIAGNOSIS — Z3A35 35 weeks gestation of pregnancy: Secondary | ICD-10-CM | POA: Diagnosis not present

## 2022-01-10 DIAGNOSIS — O165 Unspecified maternal hypertension, complicating the puerperium: Secondary | ICD-10-CM | POA: Diagnosis present

## 2022-01-10 DIAGNOSIS — O1413 Severe pre-eclampsia, third trimester: Secondary | ICD-10-CM

## 2022-01-10 DIAGNOSIS — Z87891 Personal history of nicotine dependence: Secondary | ICD-10-CM

## 2022-01-10 DIAGNOSIS — Z3483 Encounter for supervision of other normal pregnancy, third trimester: Secondary | ICD-10-CM | POA: Diagnosis not present

## 2022-01-10 DIAGNOSIS — O163 Unspecified maternal hypertension, third trimester: Principal | ICD-10-CM | POA: Diagnosis present

## 2022-01-10 DIAGNOSIS — O9081 Anemia of the puerperium: Secondary | ICD-10-CM | POA: Diagnosis present

## 2022-01-10 DIAGNOSIS — Z7982 Long term (current) use of aspirin: Secondary | ICD-10-CM

## 2022-01-10 DIAGNOSIS — O99019 Anemia complicating pregnancy, unspecified trimester: Secondary | ICD-10-CM

## 2022-01-10 DIAGNOSIS — F4322 Adjustment disorder with anxiety: Secondary | ICD-10-CM

## 2022-01-10 DIAGNOSIS — Z3480 Encounter for supervision of other normal pregnancy, unspecified trimester: Secondary | ICD-10-CM

## 2022-01-10 HISTORY — DX: Severe pre-eclampsia, third trimester: O14.13

## 2022-01-10 LAB — COMPREHENSIVE METABOLIC PANEL
ALT: 18 U/L (ref 0–44)
AST: 23 U/L (ref 15–41)
Albumin: 2.8 g/dL — ABNORMAL LOW (ref 3.5–5.0)
Alkaline Phosphatase: 152 U/L — ABNORMAL HIGH (ref 38–126)
Anion gap: 7 (ref 5–15)
BUN: 8 mg/dL (ref 6–20)
CO2: 21 mmol/L — ABNORMAL LOW (ref 22–32)
Calcium: 8.1 mg/dL — ABNORMAL LOW (ref 8.9–10.3)
Chloride: 108 mmol/L (ref 98–111)
Creatinine, Ser: 0.61 mg/dL (ref 0.44–1.00)
GFR, Estimated: >60 mL/min (ref >60)
Glucose, Bld: 96 mg/dL (ref 70–99)
Potassium: 4 mmol/L (ref 3.5–5.1)
Sodium: 136 mmol/L (ref 135–145)
Total Bilirubin: 0.4 mg/dL (ref 0.3–1.2)
Total Protein: 5.9 g/dL — ABNORMAL LOW (ref 6.5–8.1)

## 2022-01-10 LAB — URINALYSIS
Bilirubin, UA: NEGATIVE
Glucose, UA: NEGATIVE
Leukocytes,UA: NEGATIVE
Nitrite, UA: NEGATIVE
Specific Gravity, UA: 1.03 (ref 1.005–1.030)
Urobilinogen, Ur: 0.2 mg/dL (ref 0.2–1.0)
pH, UA: 6 (ref 5.0–7.5)

## 2022-01-10 LAB — CBC
HCT: 30.8 % — ABNORMAL LOW (ref 36.0–46.0)
Hemoglobin: 11 g/dL — ABNORMAL LOW (ref 12.0–15.0)
MCH: 29.7 pg (ref 26.0–34.0)
MCHC: 35.7 g/dL (ref 30.0–36.0)
MCV: 83.2 fL (ref 80.0–100.0)
Platelets: 141 10*3/uL — ABNORMAL LOW (ref 150–400)
RBC: 3.7 MIL/uL — ABNORMAL LOW (ref 3.87–5.11)
RDW: 12.2 % (ref 11.5–15.5)
WBC: 7.8 10*3/uL (ref 4.0–10.5)
nRBC: 0 % (ref 0.0–0.2)

## 2022-01-10 LAB — GROUP B STREP BY PCR: Group B strep by PCR: NEGATIVE

## 2022-01-10 LAB — PROTEIN / CREATININE RATIO, URINE
Creatinine, Urine: 338 mg/dL
Protein Creatinine Ratio: 3.4 mg/mg{Cre} — ABNORMAL HIGH (ref 0.00–0.15)
Total Protein, Urine: 1149 mg/dL

## 2022-01-10 LAB — TYPE AND SCREEN
ABO/RH(D): O POS
Antibody Screen: NEGATIVE

## 2022-01-10 LAB — ABO/RH: ABO/RH(D): O POS

## 2022-01-10 MED ORDER — LABETALOL HCL 5 MG/ML IV SOLN
80.0000 mg | INTRAVENOUS | Status: DC | PRN
  Filled 2022-01-10: qty 16

## 2022-01-10 MED ORDER — MISOPROSTOL 25 MCG QUARTER TABLET
ORAL_TABLET | ORAL | Status: AC
  Administered 2022-01-10: 25 ug via ORAL
  Filled 2022-01-10: qty 2

## 2022-01-10 MED ORDER — OXYTOCIN 10 UNIT/ML IJ SOLN
INTRAMUSCULAR | Status: AC
  Filled 2022-01-10: qty 2

## 2022-01-10 MED ORDER — ONDANSETRON HCL 4 MG/2ML IJ SOLN
4.0000 mg | Freq: Four times a day (QID) | INTRAMUSCULAR | Status: DC | PRN
  Administered 2022-01-10 – 2022-01-11 (×2): 4 mg via INTRAVENOUS
  Filled 2022-01-10 (×2): qty 2

## 2022-01-10 MED ORDER — MAGNESIUM SULFATE BOLUS VIA INFUSION
4.0000 g | Freq: Once | INTRAVENOUS | Status: AC
  Administered 2022-01-10: 4 g via INTRAVENOUS
  Filled 2022-01-10: qty 1000

## 2022-01-10 MED ORDER — DOCUSATE SODIUM 100 MG PO CAPS: 100.0000 mg | ORAL_CAPSULE | Freq: Every day | ORAL | Status: DC

## 2022-01-10 MED ORDER — MISOPROSTOL 25 MCG QUARTER TABLET
25.0000 ug | ORAL_TABLET | Freq: Once | ORAL | Status: AC
  Administered 2022-01-10: 25 ug via VAGINAL
  Filled 2022-01-10: qty 1

## 2022-01-10 MED ORDER — TERBUTALINE SULFATE 1 MG/ML IJ SOLN: 0.2500 mg | Freq: Once | INTRAMUSCULAR | Status: DC | PRN

## 2022-01-10 MED ORDER — CALCIUM CARBONATE ANTACID 500 MG PO CHEW: 2.0000 | CHEWABLE_TABLET | ORAL | Status: DC | PRN

## 2022-01-10 MED ORDER — MISOPROSTOL 25 MCG QUARTER TABLET
25.0000 ug | ORAL_TABLET | ORAL | Status: DC
  Administered 2022-01-11 (×2): 25 ug via ORAL
  Filled 2022-01-10 (×2): qty 1

## 2022-01-10 MED ORDER — LACTATED RINGERS IV SOLN: 125.0000 mL/h | INTRAVENOUS | Status: AC

## 2022-01-10 MED ORDER — ZOLPIDEM TARTRATE 5 MG PO TABS: 5.0000 mg | ORAL_TABLET | Freq: Every evening | ORAL | Status: DC | PRN

## 2022-01-10 MED ORDER — LABETALOL HCL 5 MG/ML IV SOLN
20.0000 mg | INTRAVENOUS | Status: DC | PRN
  Administered 2022-01-11 (×2): 20 mg via INTRAVENOUS
  Filled 2022-01-10: qty 4

## 2022-01-10 MED ORDER — ACETAMINOPHEN 500 MG PO TABS: 1000.0000 mg | ORAL_TABLET | Freq: Four times a day (QID) | ORAL | Status: DC | PRN

## 2022-01-10 MED ORDER — NIFEDIPINE ER OSMOTIC RELEASE 30 MG PO TB24
30.0000 mg | ORAL_TABLET | Freq: Every day | ORAL | Status: DC
  Administered 2022-01-10 – 2022-01-12 (×3): 30 mg via ORAL
  Filled 2022-01-10 (×3): qty 1

## 2022-01-10 MED ORDER — AMMONIA AROMATIC IN INHA
RESPIRATORY_TRACT | Status: AC
  Filled 2022-01-10: qty 10

## 2022-01-10 MED ORDER — LACTATED RINGERS IV SOLN: INTRAVENOUS | Status: DC

## 2022-01-10 MED ORDER — LIDOCAINE HCL (PF) 1 % IJ SOLN
INTRAMUSCULAR | Status: AC
  Filled 2022-01-10: qty 30

## 2022-01-10 MED ORDER — FENTANYL CITRATE (PF) 100 MCG/2ML IJ SOLN
50.0000 ug | INTRAMUSCULAR | Status: DC | PRN
  Administered 2022-01-11 (×2): 100 ug via INTRAVENOUS
  Filled 2022-01-10 (×2): qty 2

## 2022-01-10 MED ORDER — OXYTOCIN-SODIUM CHLORIDE 30-0.9 UT/500ML-% IV SOLN
INTRAVENOUS | Status: AC
  Administered 2022-01-11: 2 m[IU]/min via INTRAVENOUS
  Filled 2022-01-10: qty 500

## 2022-01-10 MED ORDER — LACTATED RINGERS IV SOLN
INTRAVENOUS | Status: DC
  Administered 2022-01-11: 60 mL/h via INTRAVENOUS

## 2022-01-10 MED ORDER — OXYTOCIN-SODIUM CHLORIDE 30-0.9 UT/500ML-% IV SOLN
2.5000 [IU]/h | INTRAVENOUS | Status: DC
  Administered 2022-01-11: 2.5 [IU]/h via INTRAVENOUS
  Filled 2022-01-10: qty 500

## 2022-01-10 MED ORDER — MISOPROSTOL 25 MCG QUARTER TABLET
25.0000 ug | ORAL_TABLET | Freq: Once | ORAL | Status: AC
  Administered 2022-01-10: 25 ug via ORAL

## 2022-01-10 MED ORDER — OXYTOCIN BOLUS FROM INFUSION
333.0000 mL | Freq: Once | INTRAVENOUS | Status: AC
  Administered 2022-01-11: 333 mL via INTRAVENOUS

## 2022-01-10 MED ORDER — LACTATED RINGERS IV SOLN: 500.0000 mL | INTRAVENOUS | Status: DC | PRN

## 2022-01-10 MED ORDER — LABETALOL HCL 5 MG/ML IV SOLN
INTRAVENOUS | Status: AC
  Administered 2022-01-10: 20 mg via INTRAVENOUS
  Filled 2022-01-10: qty 4

## 2022-01-10 MED ORDER — MAGNESIUM SULFATE 40 GM/1000ML IV SOLN
2.0000 g/h | INTRAVENOUS | Status: DC
  Administered 2022-01-10 – 2022-01-12 (×3): 2 g/h via INTRAVENOUS
  Filled 2022-01-10 (×2): qty 1000

## 2022-01-10 MED ORDER — LABETALOL HCL 5 MG/ML IV SOLN
INTRAVENOUS | Status: AC
  Administered 2022-01-10: 40 mg via INTRAVENOUS
  Filled 2022-01-10: qty 8

## 2022-01-10 MED ORDER — LIDOCAINE HCL (PF) 1 % IJ SOLN: 30.0000 mL | INTRAMUSCULAR | Status: DC | PRN

## 2022-01-10 MED ORDER — MISOPROSTOL 25 MCG QUARTER TABLET
25.0000 ug | ORAL_TABLET | ORAL | Status: DC
  Administered 2022-01-10 – 2022-01-11 (×3): 25 ug via VAGINAL
  Filled 2022-01-10 (×2): qty 1

## 2022-01-10 MED ORDER — MAGNESIUM SULFATE 40 GM/1000ML IV SOLN
INTRAVENOUS | Status: AC
  Filled 2022-01-10: qty 1000

## 2022-01-10 MED ORDER — CALCIUM GLUCONATE 10 % IV SOLN
INTRAVENOUS | Status: AC
  Filled 2022-01-10: qty 10

## 2022-01-10 MED ORDER — LABETALOL HCL 5 MG/ML IV SOLN: 40.0000 mg | INTRAVENOUS | Status: DC | PRN

## 2022-01-10 MED ORDER — SOD CITRATE-CITRIC ACID 500-334 MG/5ML PO SOLN: 30.0000 mL | ORAL | Status: DC | PRN

## 2022-01-10 MED ORDER — HYDRALAZINE HCL 20 MG/ML IJ SOLN: 10.0000 mg | INTRAMUSCULAR | Status: DC | PRN

## 2022-01-10 MED ORDER — MISOPROSTOL 200 MCG PO TABS
ORAL_TABLET | ORAL | Status: AC
  Filled 2022-01-10: qty 4

## 2022-01-10 NOTE — Progress Notes (Signed)
Patient G1P0 seen at [facility] today for routine [facility] visit  sent to [facility]  with elevated BP, generalized edema, 3+ protein in her urine and uterine size discrepancy.  Will do a pre eclampsia work up with NST and ultrasound.  Chari Manning CNM

## 2022-01-10 NOTE — Progress Notes (Signed)
Urinalysis ordered and result reviewed by Arnetha Courser, CNM.  Martyn Malay, RN

## 2022-01-10 NOTE — H&P (Addendum)
OB History & Physical   History of Present Illness:   Chief Complaint: elevated BP with proteinuria   HPI:  Gina Munoz is a 21 y.o. G1P0000 female at [redacted]w[redacted]d, Patient's last menstrual period was 05/07/2021 (exact date)., consistent with [place] at [redacted]w[redacted]d, with Estimated Date of Delivery: 02/11/22.  She presents to L&D for Preeclampsia with severe features  Reports active fetal movement  Contractions: denies  LOF/SROM: none Vaginal bleeding: none  Factors complicating pregnancy:  Brief psychotic disorder Anemia ASCUS with neg HR HPV  Patient Active Problem List   Diagnosis Date Noted   Elevated blood pressure affecting pregnancy in third trimester, antepartum 01/10/2022   Adjustment disorder with anxious mood 10/17/2021   Anemia affecting pregnancy, antepartum 08/23/2021   ASCUS of cervix with negative high risk HPV 07/29/21 08/15/2021   Supervision of other normal pregnancy, antepartum 07/31/2021   Brief psychotic disorder (HCC) 09/2020 10/19/2020    Prenatal Transfer Tool  Maternal Diabetes: No Genetic Screening: Normal Maternal Ultrasounds/Referrals: Normal Fetal Ultrasounds or other Referrals:  None Maternal Substance Abuse:  No Significant Maternal Medications:  None Significant Maternal Lab Results: Group B Strep negative  Maternal Medical History:   Past Medical History:  Diagnosis Date   Anemia affecting pregnancy, antepartum 08/23/2021   ASCUS of cervix with negative high risk HPV 07/29/21 08/15/2021   Mental disorder     History reviewed. No pertinent surgical history.  No Known Allergies  Prior to Admission medications   Medication Sig Start Date End Date Taking? Authorizing Provider  aspirin EC 81 MG tablet Take 81 mg by mouth daily. Swallow whole.   Yes [provider]  ferrous sulfate 325 (65 FE) MG tablet Take 1 tablet (325 mg total) by mouth daily with breakfast. 12/13/21  Yes Glenna Fellows, FNP  Prenatal Vit-Fe Fumarate-FA  (MULTIVITAMIN-PRENATAL) 27-0.8 MG TABS tablet Take 1 tablet by mouth daily at 12 noon.   Yes [provider]     Prenatal care site:  [facility]  OB History  Gravida Para Term Preterm AB Living  1 0 0 0 0 0  SAB IAB Ectopic Multiple Live Births  0 0 0 0 0    # Outcome Date GA Lbr Len/2nd Weight Sex Delivery Anes PTL Lv  1 Current             Obstetric Comments  In relationship with FOB, lives with her parents with her 2 brothers.      Social History: She  reports that she has never smoked. She has never been exposed to tobacco smoke. She quit smokeless tobacco use about 7 months ago. She reports that she does not currently use alcohol after a past usage of about 1.0 standard drink of alcohol per week. She reports that she does not use drugs.  Family History: family history includes Healthy in her brother, maternal grandmother, mother, paternal grandfather, and sister; High Cholesterol in her father.   Review of Systems: A full review of systems was performed and negative except as noted in the HPI.     Physical Exam:  Vital Signs: BP (!) 163/102   Pulse 80   Temp 98.1 F (36.7 C) (Oral)   Resp 18   Ht 5' (1.524 m)   Wt 62.1 kg   LMP 05/07/2021 (Exact Date) Comment: normal menses per pt  BMI 26.76 kg/m   General: no acute distress.  HEENT: normocephalic, atraumatic Heart: regular rate & rhythm Lungs: normal respiratory effort Abdomen: soft, gravid, non-tender;  EFW: 5  lb 6.5 oz Pelvic:   External: Normal external female genitalia  Cervix: Dilation: Closed /   /      Extremities: non-tender, symmetric, 2+ edema bilaterally.  DTRs: +3  Neurologic: Alert & oriented x 3.    Results for orders placed or performed during the hospital encounter of 01/10/22 (from the past 24 hour(s))  Comprehensive metabolic panel     Status: Abnormal   Collection Time: 01/10/22  4:17 PM  Result Value Ref Range   Sodium 136 135 - 145 mmol/L   Potassium 4.0 3.5 - 5.1 mmol/L    Chloride 108 98 - 111 mmol/L   CO2 21 (L) 22 - 32 mmol/L   Glucose, Bld 96 70 - 99 mg/dL   BUN 8 6 - 20 mg/dL   Creatinine, Ser 0.61 0.44 - 1.00 mg/dL   Calcium 8.1 (L) 8.9 - 10.3 mg/dL   Total Protein 5.9 (L) 6.5 - 8.1 g/dL   Albumin 2.8 (L) 3.5 - 5.0 g/dL   AST 23 15 - 41 U/L   ALT 18 0 - 44 U/L   Alkaline Phosphatase 152 (H) 38 - 126 U/L   Total Bilirubin 0.4 0.3 - 1.2 mg/dL   GFR, Estimated >60 >60 mL/min   Anion gap 7 5 - 15  CBC     Status: Abnormal   Collection Time: 01/10/22  4:17 PM  Result Value Ref Range   WBC 7.8 4.0 - 10.5 K/uL   RBC 3.70 (L) 3.87 - 5.11 MIL/uL   Hemoglobin 11.0 (L) 12.0 - 15.0 g/dL   HCT 30.8 (L) 36.0 - 46.0 %   MCV 83.2 80.0 - 100.0 fL   MCH 29.7 26.0 - 34.0 pg   MCHC 35.7 30.0 - 36.0 g/dL   RDW 12.2 11.5 - 15.5 %   Platelets 141 (L) 150 - 400 K/uL   nRBC 0.0 0.0 - 0.2 %  Protein / creatinine ratio, urine     Status: Abnormal   Collection Time: 01/10/22  4:21 PM  Result Value Ref Range   Creatinine, Urine 338 mg/dL   Total Protein, Urine 1,149 mg/dL   Protein Creatinine Ratio 3.40 (H) 0.00 - 0.15 mg/mg[Cre]    Pertinent Results:  Prenatal Labs: Blood type/Rh O   Antibody screen Negative    Rubella unknown  Varicella Unknown  RPR Non Reactive (09/22 0946)   HBsAg Negative (06/05 1025)  Hep C NR   HIV Non Reactive (06/05 1025)   GC neg  Chlamydia neg  Genetic screening cfDNA negative   1 hour GTT 87  3 hour GTT none  GBS pending     FHT:  FHR: 140 bpm, variability: moderate,  accelerations:  Present,  decelerations:  Absent Category/reactivity:  Category I UC:   none   Cephalic by bedside US   No results found.  Assessment:  Gina Munoz is a 21 y.o. G1P0000 female at [redacted]w[redacted]d with pre eclampsia with severe features, anemia.   Plan:  1. Admit to Labor & Delivery - consents reviewed and obtained - Dr. Leafy Ro notified of admission and plan of care   2. Fetal Well being  - Fetal Tracing: category 1 - Group B  Streptococcus ppx not indicated: GBS negative - Presentation: Cephalic confirmed by [place]   3. Routine OB: - Prenatal labs reviewed, as above - Rh positive - CBC, T&S, RPR on admit - Clear liquid diet , continuous IV fluids  4. Induction of labor  - Contractions monitored with external toco -  Pelvis  adequate for trial of labor  - Plan for induction with misoprostol  - Augmentation with oxytocin, AROM, and cervical balloon as appropriate  - Plan for  continuous fetal monitoring - Maternal pain control as desired; planning regional anesthesia and IVPM - Anticipate vaginal delivery  -NICU consulted - Start Magnesium 4g bolus then 2 g /hr - insert foley catheter for strict I'Os  -Seizure precautions -order for conditional labetalol -Start Procardia xl 30 mg daily -SCD's    5. Post Partum Planning: - Infant feeding: breast and formula feeding - Contraception: IUD - Tdap vaccine: Given prenatally - Flu vaccine: Given prenatally  Gina Munoz, CNM 01/10/22 5:23 PM  Gina Munoz, CNM Certified Nurse Midwife [facility]

## 2022-01-10 NOTE — Progress Notes (Signed)
Labor Check  Subj:  Complaints: }has no unusual complaints  Obj:  BP (!) 154/100   Pulse 84   Temp 97.9 F (36.6 C) (Oral)   Resp 16   Ht 5' (1.524 m)   Wt 62.1 kg   LMP 05/07/2021 (Exact Date) Comment: normal menses per pt  SpO2 98%   BMI 26.76 kg/m     Cervix: Dilation: 1 / Effacement (%): 70 / Station: -2  Baseline QIW:LNLGXQJJ: 125 bpm, Variability: Good {> 6 bpm), Accelerations: Reactive, and Decelerations: Absent Contractions: regular, every 3-4 minutes Overall assessment: reassuring    A/P: 21 y.o. G1P0000 female at [redacted]w[redacted]d with Preeclampsia  1.  Labor: Satisfactory labor progress. Labor Progressing normally and placed second dose of vaginal and oral Cytotec and pain controlled  Labor support without medications 2.  HER:DEYCXKG assessment: Category I 3.  Group B Strep negative 4. Membranes intact 5.  Pain: none 6.  Recheck:Evaluated by digital exam. 7. Continue present management. and Anticipate vaginal delivery.  Chari Manning [facility] 01/10/2022 10:53 PM

## 2022-01-10 NOTE — Progress Notes (Signed)
[facility]  PRENATAL VISIT NOTE  Subjective:  Gina Munoz is a 21 y.o. G1P0000 at [redacted]w[redacted]d being seen today for ongoing prenatal care.  She is currently monitored for the following issues for this high-risk pregnancy and has Brief psychotic disorder (HCC) 09/2020; Supervision of other normal pregnancy, antepartum; ASCUS of cervix with negative high risk HPV 07/29/21; Anemia affecting pregnancy, antepartum; and Adjustment disorder with anxious mood on their problem list.  Patient reports no complaints.  Contractions: Not present. Vag. Bleeding: None.  Movement: Present. Denies leaking of fluid/ROM.   The following portions of the patient's history were reviewed and updated as appropriate: allergies, current medications, past family history, past medical history, past social history, past surgical history and problem list. Problem list updated.  Objective:   Vitals:   01/10/22 1412  BP: (!) 139/94  Pulse: 83  Temp: (!) 97.5 F (36.4 C)  Weight: 137 lb (62.1 kg)    Fetal Status:   Fundal Height: 32 cm Movement: Present     General:  Alert, oriented and cooperative. Patient is in no acute distress.  Skin: Skin is warm and dry. No rash noted.   Cardiovascular: Normal heart rate noted  Respiratory: Normal respiratory effort, no problems with respiration noted  Abdomen: Soft, gravid, appropriate for gestational age.  Pain/Pressure: Absent     Pelvic: Cervical exam deferred        Extremities: Normal range of motion.  Edema: Mild pitting, slight indentation  Mental Status: Normal mood and affect. Normal behavior. Normal judgment and thought content.   Assessment and Plan:  Pregnancy: G1P0000 at [redacted]w[redacted]d  1. 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 Anemia affecting pregnancy, antepartum Taking FeSo4 I daily with oj  3. Adjustment disorder with anxious mood Receiving counseling from [facility], LCSW 10/16/21, [facility] 10/30/21, 12/25/21, [facility] 01/01/22, 01/15/22   Preterm labor symptoms and general obstetric precautions including but not limited to vaginal bleeding, contractions, leaking of fluid and fetal movement were reviewed in detail with the patient. Please refer to After Visit Summary for other counseling recommendations.  No follow-ups on file.  Future Appointments  Date Time Provider Department Center  01/15/2022  8:40 AM [facility] PROVIDER [facility] None  01/15/2022  2:00 PM Kathreen Cosier, LCSW [facility] None    Alberteen Spindle, CNM

## 2022-01-11 ENCOUNTER — Inpatient Hospital Stay: Payer: Medicaid Other | Admitting: Anesthesiology

## 2022-01-11 DIAGNOSIS — Z3483 Encounter for supervision of other normal pregnancy, third trimester: Secondary | ICD-10-CM | POA: Diagnosis not present

## 2022-01-11 LAB — CBC
HCT: 33 % — ABNORMAL LOW (ref 36.0–46.0)
Hemoglobin: 11.7 g/dL — ABNORMAL LOW (ref 12.0–15.0)
MCH: 29.3 pg (ref 26.0–34.0)
MCHC: 35.5 g/dL (ref 30.0–36.0)
MCV: 82.5 fL (ref 80.0–100.0)
Platelets: 140 10*3/uL — ABNORMAL LOW (ref 150–400)
RBC: 4 MIL/uL (ref 3.87–5.11)
RDW: 12.1 % (ref 11.5–15.5)
WBC: 11.1 10*3/uL — ABNORMAL HIGH (ref 4.0–10.5)
nRBC: 0 % (ref 0.0–0.2)

## 2022-01-11 LAB — MAGNESIUM
Magnesium: 6.5 mg/dL (ref 1.7–2.4)
Magnesium: 6 mg/dL — ABNORMAL HIGH (ref 1.7–2.4)

## 2022-01-11 MED ORDER — FENTANYL-BUPIVACAINE-NACL 0.5-0.125-0.9 MG/250ML-% EP SOLN
EPIDURAL | Status: DC | PRN
  Administered 2022-01-11: 12 mL/h via EPIDURAL

## 2022-01-11 MED ORDER — SODIUM CHLORIDE 0.9 % IV SOLN
INTRAVENOUS | Status: DC | PRN
  Administered 2022-01-11: 4 mL via EPIDURAL
  Administered 2022-01-11: 5 mL via EPIDURAL

## 2022-01-11 MED ORDER — LIDOCAINE-EPINEPHRINE (PF) 1.5 %-1:200000 IJ SOLN
INTRAMUSCULAR | Status: DC | PRN
  Administered 2022-01-11: 4 mL via EPIDURAL

## 2022-01-11 MED ORDER — FENTANYL-BUPIVACAINE-NACL 0.5-0.125-0.9 MG/250ML-% EP SOLN
EPIDURAL | Status: AC
  Filled 2022-01-11: qty 250

## 2022-01-11 MED ORDER — TERBUTALINE SULFATE 1 MG/ML IJ SOLN: 0.2500 mg | Freq: Once | INTRAMUSCULAR | Status: DC | PRN

## 2022-01-11 MED ORDER — LIDOCAINE HCL (PF) 1 % IJ SOLN
INTRAMUSCULAR | Status: DC | PRN
  Administered 2022-01-11: 3 mL via SUBCUTANEOUS

## 2022-01-11 MED ORDER — OXYTOCIN-SODIUM CHLORIDE 30-0.9 UT/500ML-% IV SOLN: 1.0000 m[IU]/min | INTRAVENOUS | Status: DC

## 2022-01-11 NOTE — Anesthesia Preprocedure Evaluation (Signed)
Anesthesia Evaluation  Patient identified by MRN, date of birth, ID band Patient awake    Reviewed: Allergy & Precautions, H&P , NPO status , Patient's Chart, lab work & pertinent test results, reviewed documented beta blocker date and time   Airway Mallampati: II  TM Distance: >3 FB Neck ROM: full    Dental no notable dental hx. (+) Teeth Intact   Pulmonary neg pulmonary ROS   Pulmonary exam normal breath sounds clear to auscultation       Cardiovascular Exercise Tolerance: Good hypertension, On Medications negative cardio ROS  Rhythm:regular Rate:Normal     Neuro/Psych  PSYCHIATRIC DISORDERS      negative neurological ROS     GI/Hepatic negative GI ROS, Neg liver ROS,,,  Endo/Other  negative endocrine ROSdiabetes, Gestational    Renal/GU      Musculoskeletal   Abdominal   Peds  Hematology  (+) Blood dyscrasia, anemia   Anesthesia Other Findings   Reproductive/Obstetrics (+) Pregnancy                             Anesthesia Physical Anesthesia Plan  ASA: 2  Anesthesia Plan: Epidural   Post-op Pain Management:    Induction:   PONV Risk Score and Plan:   Airway Management Planned:   Additional Equipment:   Intra-op Plan:   Post-operative Plan:   Informed Consent: I have reviewed the patients History and Physical, chart, labs and discussed the procedure including the risks, benefits and alternatives for the proposed anesthesia with the patient or authorized representative who has indicated his/her understanding and acceptance.       Plan Discussed with:   Anesthesia Plan Comments:        Anesthesia Quick Evaluation

## 2022-01-11 NOTE — Progress Notes (Signed)
Labor Progress Note  Gina Munoz is a 21 y.o. G1P0000 at 105w4d by LMP admitted for induction of labor due to pre-eclampsia with severe features.  Subjective: she was experiencing back pain that has now resolved, currently sleeping in the bed, resps even/unlabored, family at bedside.  Objective: BP (!) 159/99   Pulse 88   Temp 97.9 F (36.6 C) (Oral)   Resp 16   Ht 5' (1.524 m)   Wt 62.1 kg   LMP 05/07/2021 (Exact Date) Comment: normal menses per pt  SpO2 100%   BMI 26.76 kg/m  Notable VS details: reviewed  Fetal Assessment: FHT:  FHR: 120 bpm, variability: moderate,  accelerations:  Present,  decelerations:  Absent Category/reactivity:  Category I UC:   irregular, every 1-5 minutes SVE:    Cooke catheter still in place Membrane status: intact Amniotic color: n/a  Labs: Lab Results  Component Value Date   WBC 7.8 01/10/2022   HGB 11.0 (L) 01/10/2022   HCT 30.8 (L) 01/10/2022   MCV 83.2 01/10/2022   PLT 141 (L) 01/10/2022    Assessment / Plan: 21 year old G1P0 at [redacted]w[redacted]d here for IOL for pre-eclampsia with severe features  Labor:  Has received 3 doses cytotec, Cooke catheter in place, now starting pitocin. Preeclampsia:  on magnesium sulfate, no s/s toxicity Fetal Wellbeing:  Category I Pain Control:  Labor support without medications I/D:   GBS negative, BOW intact Anticipated MOD:  NSVD  Janyce Llanos, CNM 01/11/2022, 12:26 PM

## 2022-01-11 NOTE — Discharge Summary (Signed)
Obstetrical Discharge Summary  Patient Name: Gina Munoz DOB: May 01, 2000 MRN: [id number]  Date of Admission: 01/10/2022 Date of Delivery: 01/11/22 Delivered by: Donato Schultz, CNM Date of Discharge: 01/15/2022  Primary OB: [facility] KGY:JEHUDJS'H last menstrual period was 05/07/2021 (exact date). EDC Estimated Date of Delivery: 02/11/22 Gestational Age at Delivery: [redacted]w[redacted]d   Antepartum complications:  Brief psychotic disorder Anemia ASCUS with neg HR HPV  Admitting Diagnosis: Elevated blood pressure affecting pregnancy in third trimester, antepartum [O16.3] Preeclampsia, severe, third trimester [O14.13]  Secondary Diagnosis: Patient Active Problem List   Diagnosis Date Noted   Hypertension, postpartum condition or complication 01/14/2022   NSVD (normal spontaneous vaginal delivery) 01/11/2022   Elevated blood pressure affecting pregnancy in third trimester, antepartum 01/10/2022   Preeclampsia, with severe features, third trimester at 35 wks 01/10/2022   Adjustment disorder with anxious mood 10/17/2021   Anemia affecting pregnancy, antepartum 08/23/2021   ASCUS of cervix with negative high risk HPV 07/29/21 08/15/2021   Supervision of other normal pregnancy, antepartum 07/31/2021   Brief psychotic disorder (HCC) 09/2020 10/19/2020    Discharge Diagnosis: Preterm Pregnancy Delivered and Preeclampsia (severe)      Augmentation: AROM, Pitocin, Cytotec, and OP Foley Complications: None Intrapartum complications/course: She was sent over for [facility] work-up from the office and was found to have pre-eclampsia with severe features. She was started on magnesium and induced with cytotec, Cooke catheter, pitocin, and AROM. She progressed to 10/100/+3 and pushed 10 minutes, delivering viable female infant over intact perineum. Apgars 8/9. Delivery Type: spontaneous vaginal delivery Anesthesia: epidural anesthesia Placenta: spontaneous To Pathology: No  Laceration: none Episiotomy:  none Newborn Data: Live born child  Birth Weight:  4lb 7oz APGAR: 7, 8  Newborn Delivery   Birth date/time: 01/11/2022 22:33:22 Delivery type: Vaginal, Spontaneous      Postpartum Procedures:  magnesium sulfate Edinburgh:     01/12/2022   12:03 PM  Edinburgh Postnatal Depression Scale Screening Tool  I have been able to laugh and see the funny side of things. 0  I have looked forward with enjoyment to things. 0  I have blamed myself unnecessarily when things went wrong. 2  I have been anxious or worried for no good reason. 2  I have felt scared or panicky for no good reason. 0  Things have been getting on top of me. 2  I have been so unhappy that I have had difficulty sleeping. 3  I have felt sad or miserable. 1  I have been so unhappy that I have been crying. 0  The thought of harming myself has occurred to me. 0  Edinburgh Postnatal Depression Scale Total 10     Post partum course:  Patient had a postpartum course complicated by preeclampsia with severe features.  She received magnesium sulfate for 24 hours postpartum.  Her blood pressure was controlled with oral antihypertensives.  By time of discharge on PPD#3, her pain was controlled on oral pain medications; she had appropriate lochia and was ambulating, voiding without difficulty and tolerating regular diet.  She was deemed stable for discharge to home.    Discharge Physical Exam:  BP (!) 140/82 (BP Location: Right Arm)   Pulse 72   Temp (!) 97.5 F (36.4 C) (Oral)   Resp 18   Ht 5' (1.524 m)   Wt 55.8 kg   LMP 05/07/2021 (Exact Date) Comment: normal menses per pt  SpO2 100%   Breastfeeding Unknown   BMI 24.02 kg/m   General: NAD CV: RRR Pulm:  CTABL, nl effort ABD: s/nd/nt, fundus firm and below the umbilicus Lochia: moderate Perineum: minimal edema/intact DVT Evaluation: LE non-ttp, no evidence of DVT on exam.  Hemoglobin  Date Value Ref Range Status  01/15/2022 10.0 (L) 12.0 - 15.0 g/dL Final   08/23/2021 10.8 (L) 11.1 - 15.9 g/dL Final   HCT  Date Value Ref Range Status  01/15/2022 29.3 (L) 36.0 - 46.0 % Final   Hematocrit  Date Value Ref Range Status  08/23/2021 31.8 (L) 34.0 - 46.6 % Final    Risk assessment for postpartum VTE and prophylactic treatment: Very high risk factors: None High risk factors: None Moderate risk factors: Preeclampsia   Postpartum VTE prophylaxis with LMWH not indicated  Disposition: stable, discharge to home. Baby Feeding: breast and formula feeding Baby Disposition: SCN   Rh Immune globulin indicated: No Rubella vaccine given: was offered prior to discharge  Varivax vaccine given: was not indicated Flu vaccine given in AP setting: No Tdap vaccine given in AP setting: Yes   Contraception: IUD (Paragard)  Prenatal Labs:  Blood type/Rh O   Antibody screen Negative    Rubella unknown  Varicella Unknown  RPR Non Reactive (09/22 0946)   HBsAg Negative (06/05 1025)  Hep C NR   HIV Non Reactive (06/05 1025)   GC neg  Chlamydia neg  Genetic screening cfDNA negative   1 hour GTT 87  3 hour GTT none  GBS negative     Plan:  Jeneice Risinger was discharged to home in good condition. Follow-up appointment with delivering provider in 5 days for blood pressure check. Mood check in 2 wks.  Discharge Medications: Allergies as of 01/15/2022   No Known Allergies      Medication List     STOP taking these medications    aspirin EC 81 MG tablet       TAKE these medications    acetaminophen 325 MG tablet Commonly known as: Tylenol Take 2 tablets (650 mg total) by mouth every 4 (four) hours as needed (for pain scale < 4).   ferrous sulfate 325 (65 FE) MG tablet Take 1 tablet (325 mg total) by mouth daily with breakfast.   ibuprofen 600 MG tablet Commonly known as: ADVIL Take 1 tablet (600 mg total) by mouth every 6 (six) hours.   labetalol 300 MG tablet Commonly known as: NORMODYNE Take 1 tablet (300 mg total) by  mouth 3 (three) times daily.   multivitamin-prenatal 27-0.8 MG Tabs tablet Take 1 tablet by mouth daily at 12 noon.   NIFEdipine 90 MG 24 hr tablet Commonly known as: PROCARDIA XL/NIFEDICAL-XL Take 1 tablet (90 mg total) by mouth daily.         Follow-up [facility] Follow up in 6 day(s).   Why: BP check Contact information: [place] [phone]                Signed:  Terance Ice 01/15/2022 9:35 AM  Drinda Butts, CNM Certified Nurse Midwife [facility]

## 2022-01-11 NOTE — Progress Notes (Signed)
Labor Progress Note  Nicha Hemann is a 21 y.o. G1P0000 at [redacted]w[redacted]d by LMP admitted for induction of labor due to pre-eclampsia with severe features.  Subjective: she rates her contractions as 1/10 pain  Objective: BP (!) 149/94   Pulse 91   Temp 98.1 F (36.7 C) (Oral)   Resp 16   Ht 5' (1.524 m)   Wt 62.1 kg   LMP 05/07/2021 (Exact Date) Comment: normal menses per pt  SpO2 99%   BMI 26.76 kg/m  Notable VS details: reviewed  Vitals:   01/11/22 0634 01/11/22 0704 01/11/22 0719 01/11/22 0730  BP: (!) 141/92 (!) 149/100 (!) 136/95 (!) 145/92   01/11/22 0830 01/11/22 0931 01/11/22 1030 01/11/22 1140  BP: (!) 142/94 (!) 125/94 134/86 (!) 159/99   01/11/22 1230 01/11/22 1330 01/11/22 1430 01/11/22 1532  BP: (!) 143/92 (!) 141/74 132/86 (!) 149/94   I/O last 3 completed shifts: In: 2594.5 [P.O.:929; I.V.:1665.5] Out: 3445 [Urine:2845; Emesis/NG output:600] Total I/O In: 1512.9 [P.O.:360; I.V.:1152.9] Out: 933 [Urine:933]   Fetal Assessment: FHT:  FHR: 125 bpm, variability: minimal ,  accelerations:  Present,  decelerations:  Absent Category/reactivity:  Category II UC:   irregular, every 1-5 minutes SVE:    Cooke catheter still in place Membrane status: intact Amniotic color: n/a  Labs: Lab Results  Component Value Date   WBC 7.8 01/10/2022   HGB 11.0 (L) 01/10/2022   HCT 30.8 (L) 01/10/2022   MCV 83.2 01/10/2022   PLT 141 (L) 01/10/2022    Assessment / Plan: 21 year old G1P0 at [redacted]w[redacted]d here for IOL for pre-eclampsia with severe features  Labor:  has received 3 doses cytotec, Cooke catheter in place since 0830, pitocin currently at  96mu/min Preeclampsia:  on magnesium sulfate, no signs or symptoms of toxicity, and intake and ouput balanced Fetal Wellbeing:  Category II Pain Control:  Labor support without medications I/D:   GBS negative Anticipated MOD:  NSVD  Janyce Llanos, CNM 01/11/2022, 3:40 PM

## 2022-01-11 NOTE — Progress Notes (Addendum)
Labor Check  Subj:  Complaints: }has no unusual complaints  Obj:  BP (!) 145/92 (BP Location: Right Arm)   Pulse 96   Temp 98.2 F (36.8 C) (Oral)   Resp 16   Ht 5' (1.524 m)   Wt 62.1 kg   LMP 05/07/2021 (Exact Date) Comment: normal menses per pt  SpO2 95%   BMI 26.76 kg/m     Cervix: Dilation: 1 / Effacement (%): 70 / Station: -1  Baseline AJO:INOMVEHM: 125 bpm, Variability: Good {> 6 bpm), Accelerations: Reactive, and Decelerations: Absent Contractions: irregular, every 5-6 minutes Overall assessment: reassuring    A/P: 21 y.o. G1P0000 female at [redacted]w[redacted]d with Preeclampsia  1.  Labor: Satisfactory labor progress. Labor Progressing normally and Cooks Cath placed along with Cytotec, preeclampsia on magnesium sulfate, no signs or symptoms of toxicity, and intake and ouput balanced, and pain controlled  Labor support without medications, Epidural, and IV pain meds 2.  CNO:BSJGGEZ assessment: Category I 3.  Group B Strep negative 4. Membranes intact 5.  Pain: present - adequately treated 6.  Recheck:Evaluated by sterile speculum exam. and Evaluated by digital exam. 7. Continue present management. and Anticipate vaginal delivery.  Chari Manning [facility] 01/11/2022 8:32 AM

## 2022-01-11 NOTE — Progress Notes (Signed)
Labor Progress Note  Gina Munoz is a 21 y.o. G1P0000 at [redacted]w[redacted]d by LMP admitted for induction of labor due to pre-eclampsia with severe features.  Subjective: she reports feeling occasional contractions, only mild pain  Objective: BP 138/85   Pulse 95   Temp 98.1 F (36.7 C) (Oral)   Resp 16   Ht 5' (1.524 m)   Wt 62.1 kg   LMP 05/07/2021 (Exact Date) Comment: normal menses per pt  SpO2 98%   BMI 26.76 kg/m  Notable VS details: reviewed  Vitals:   01/11/22 0730 01/11/22 0830 01/11/22 0931 01/11/22 1030  BP: (!) 145/92 (!) 142/94 (!) 125/94 134/86   01/11/22 1140 01/11/22 1230 01/11/22 1330 01/11/22 1430  BP: (!) 159/99 (!) 143/92 (!) 141/74 132/86   01/11/22 1532 01/11/22 1632 01/11/22 1732 01/11/22 1832  BP: (!) 149/94 (!) 133/93 (!) 159/102 138/85     Fetal Assessment: FHT:  FHR: 135 bpm, variability: moderate,  accelerations:  Present,  decelerations:  Present intermittent early decelerations Category/reactivity:  Category I UC:   irregular, every 2-5 minutes SVE:    Dilation: 8cm  Effacement: 70%  Station:  -1  Consistency: soft  Position: middle  Membrane status:AROM @ 1848 Amniotic color: clear  Labs: Lab Results  Component Value Date   WBC 7.8 01/10/2022   HGB 11.0 (L) 01/10/2022   HCT 30.8 (L) 01/10/2022   MCV 83.2 01/10/2022   PLT 141 (L) 01/10/2022    Assessment / Plan: Induction of labor due to pre-eclampsia with severe features,  progressing well on pitocin  Labor:  Glendell Docker catheter removed and she was 8/70/-1. AROM with clear fluid. Pitocin currently at 90mu/min Preeclampsia:  on magnesium sulfate, no signs or symptoms of toxicity, intake and ouput balanced, and labs stable Fetal Wellbeing:  Category I Pain Control:  Labor support without medications I/D:   GBS negative Anticipated MOD:  NSVD  Janyce Llanos, CNM 01/11/2022, 6:58 PM

## 2022-01-11 NOTE — Anesthesia Procedure Notes (Signed)
Epidural Patient location during procedure: OB End time: 01/11/2022 9:23 PM  Staffing Anesthesiologist: Yevette Edwards, MD Performed: anesthesiologist   Preanesthetic Checklist Completed: patient identified, IV checked, site marked, risks and benefits discussed, surgical consent, monitors and equipment checked, pre-op evaluation and timeout performed  Epidural Patient position: sitting Prep: Betadine Patient monitoring: heart rate, continuous pulse ox and blood pressure Approach: midline Location: L4-L5 Injection technique: LOR saline  Needle:  Needle type: Tuohy  Needle gauge: 17 G Needle length: 9 cm and 9 Needle insertion depth: 6 cm Catheter type: closed end flexible Catheter size: 19 Gauge Catheter at skin depth: 12 cm Test dose: negative and 1.5% lidocaine with Epi 1:200 K  Assessment Events: blood not aspirated, injection not painful, no injection resistance, no paresthesia and negative IV test  Additional Notes   Patient tolerated the insertion well without complications.Reason for block:procedure for pain

## 2022-01-12 ENCOUNTER — Encounter: Payer: Self-pay | Admitting: Obstetrics and Gynecology

## 2022-01-12 LAB — CBC
HCT: 27.8 % — ABNORMAL LOW (ref 36.0–46.0)
Hemoglobin: 9.7 g/dL — ABNORMAL LOW (ref 12.0–15.0)
MCH: 29 pg (ref 26.0–34.0)
MCHC: 34.9 g/dL (ref 30.0–36.0)
MCV: 83.2 fL (ref 80.0–100.0)
Platelets: 125 10*3/uL — ABNORMAL LOW (ref 150–400)
RBC: 3.34 MIL/uL — ABNORMAL LOW (ref 3.87–5.11)
RDW: 12.4 % (ref 11.5–15.5)
WBC: 17 10*3/uL — ABNORMAL HIGH (ref 4.0–10.5)
nRBC: 0 % (ref 0.0–0.2)

## 2022-01-12 LAB — MAGNESIUM
Magnesium: 6.4 mg/dL (ref 1.7–2.4)
Magnesium: 5.8 mg/dL — ABNORMAL HIGH (ref 1.7–2.4)

## 2022-01-12 LAB — PROTEIN / CREATININE RATIO, URINE
Creatinine, Urine: 343.5 mg/dL
Protein, Ur: 1121 mg/dL
Protein/Creat Ratio: 3263 mg/g creat — ABNORMAL HIGH (ref 0–200)

## 2022-01-12 MED ORDER — NIFEDIPINE ER OSMOTIC RELEASE 30 MG PO TB24
30.0000 mg | ORAL_TABLET | Freq: Once | ORAL | Status: AC
  Administered 2022-01-12: 30 mg via ORAL
  Filled 2022-01-12: qty 1

## 2022-01-12 MED ORDER — TETANUS-DIPHTH-ACELL PERTUSSIS 5-2.5-18.5 LF-MCG/0.5 IM SUSY
0.5000 mL | PREFILLED_SYRINGE | Freq: Once | INTRAMUSCULAR | Status: DC
  Filled 2022-01-12: qty 0.5

## 2022-01-12 MED ORDER — NIFEDIPINE ER OSMOTIC RELEASE 30 MG PO TB24: 30.0000 mg | ORAL_TABLET | Freq: Every day | ORAL | Status: DC

## 2022-01-12 MED ORDER — VARICELLA VIRUS VACCINE LIVE 1350 PFU/0.5ML IJ SUSR
0.5000 mL | Freq: Once | INTRAMUSCULAR | Status: DC
  Filled 2022-01-12: qty 0.5

## 2022-01-12 MED ORDER — DIBUCAINE (PERIANAL) 1 % EX OINT
1.0000 | TOPICAL_OINTMENT | CUTANEOUS | Status: DC | PRN
  Filled 2022-01-12: qty 28

## 2022-01-12 MED ORDER — SIMETHICONE 80 MG PO CHEW: 80.0000 mg | CHEWABLE_TABLET | ORAL | Status: DC | PRN

## 2022-01-12 MED ORDER — PHENYLEPHRINE 80 MCG/ML (10ML) SYRINGE FOR IV PUSH (FOR BLOOD PRESSURE SUPPORT): 80.0000 ug | PREFILLED_SYRINGE | INTRAVENOUS | Status: DC | PRN

## 2022-01-12 MED ORDER — PRENATAL MULTIVITAMIN CH
1.0000 | ORAL_TABLET | Freq: Every day | ORAL | Status: DC
  Administered 2022-01-12 – 2022-01-15 (×4): 1 via ORAL
  Filled 2022-01-12 (×4): qty 1

## 2022-01-12 MED ORDER — ONDANSETRON HCL 4 MG/2ML IJ SOLN: 4.0000 mg | INTRAMUSCULAR | Status: DC | PRN

## 2022-01-12 MED ORDER — ZOLPIDEM TARTRATE 5 MG PO TABS: 5.0000 mg | ORAL_TABLET | Freq: Every evening | ORAL | Status: DC | PRN

## 2022-01-12 MED ORDER — ACETAMINOPHEN 325 MG PO TABS
650.0000 mg | ORAL_TABLET | ORAL | Status: DC | PRN
  Administered 2022-01-12: 650 mg via ORAL
  Filled 2022-01-12: qty 2

## 2022-01-12 MED ORDER — NIFEDIPINE ER OSMOTIC RELEASE 30 MG PO TB24: 60.0000 mg | ORAL_TABLET | Freq: Every day | ORAL | Status: DC

## 2022-01-12 MED ORDER — WITCH HAZEL-GLYCERIN EX PADS
1.0000 | MEDICATED_PAD | CUTANEOUS | Status: DC | PRN
  Administered 2022-01-12: 1 via TOPICAL
  Filled 2022-01-12: qty 100

## 2022-01-12 MED ORDER — IBUPROFEN 600 MG PO TABS
600.0000 mg | ORAL_TABLET | Freq: Four times a day (QID) | ORAL | Status: DC
  Administered 2022-01-12 – 2022-01-15 (×13): 600 mg via ORAL
  Filled 2022-01-12 (×13): qty 1

## 2022-01-12 MED ORDER — EPHEDRINE 5 MG/ML INJ: 10.0000 mg | INTRAVENOUS | Status: DC | PRN

## 2022-01-12 MED ORDER — BENZOCAINE-MENTHOL 20-0.5 % EX AERO
1.0000 | INHALATION_SPRAY | CUTANEOUS | Status: DC | PRN
  Administered 2022-01-12: 1 via TOPICAL
  Filled 2022-01-12: qty 56

## 2022-01-12 MED ORDER — DIPHENHYDRAMINE HCL 50 MG/ML IJ SOLN: 12.5000 mg | INTRAMUSCULAR | Status: DC | PRN

## 2022-01-12 MED ORDER — COCONUT OIL OIL: 1.0000 | TOPICAL_OIL | Status: DC | PRN

## 2022-01-12 MED ORDER — DIPHENHYDRAMINE HCL 25 MG PO CAPS: 25.0000 mg | ORAL_CAPSULE | Freq: Four times a day (QID) | ORAL | Status: DC | PRN

## 2022-01-12 MED ORDER — LACTATED RINGERS IV SOLN: 500.0000 mL | Freq: Once | INTRAVENOUS | Status: DC

## 2022-01-12 MED ORDER — ONDANSETRON HCL 4 MG PO TABS: 4.0000 mg | ORAL_TABLET | ORAL | Status: DC | PRN

## 2022-01-12 MED ORDER — FENTANYL-BUPIVACAINE-NACL 0.5-0.125-0.9 MG/250ML-% EP SOLN: 12.0000 mL/h | EPIDURAL | Status: DC | PRN

## 2022-01-12 MED ORDER — SENNOSIDES-DOCUSATE SODIUM 8.6-50 MG PO TABS
2.0000 | ORAL_TABLET | Freq: Every day | ORAL | Status: DC
  Administered 2022-01-12 – 2022-01-15 (×4): 2 via ORAL
  Filled 2022-01-12 (×4): qty 2

## 2022-01-12 NOTE — Progress Notes (Signed)
Post Partum Day 1 Subjective: Doing well, no complaints.  Tolerating regular diet, pain with PO meds, ambulating without difficulty. Foley catheter in place to measure hourly output.  No CP SOB Fever,Chills, N/V or leg pain; denies nipple or breast pain, no HA change of vision, RUQ/epigastric pain  Objective: BP 121/64   Pulse 80   Temp 98.1 F (36.7 C) (Oral)   Resp 16   Ht 5' (1.524 m)   Wt 62.1 kg   LMP 05/07/2021 (Exact Date) Comment: normal menses per pt  SpO2 98%   Breastfeeding Unknown   BMI 26.76 kg/m    Physical Exam:  General: NAD Breasts: soft/nontender CV: RRR Pulm: nl effort, CTABL Abdomen: soft, NT, BS x 4 Perineum: minimal edema, intact Lochia: moderate Uterine Fundus: fundus firm and 1 fb below umbilicus DVT Evaluation: no cords, ttp LEs   Recent Labs    01/11/22 2012 01/12/22 0810  HGB 11.7* 9.7*  HCT 33.0* 27.8*  WBC 11.1* 17.0*  PLT 140* 125*   Last magnesium level @ 0810: 5.8  I/O last 3 completed shifts: In: 7084.6 [P.O.:1169; I.V.:5915.6] Out: 8295 [AOZHY:8657; Blood:120] Total I/O In: 181.6 [I.V.:181.6] Out: 430 [Urine:430]   Assessment/Plan: 21 y.o. G1P0101 postpartum day # 1  - Continue routine PP care - Lactation consult - Pre-eclampsia: postpartum magnesium, BP improved, good urine output. Magnesium can be stopped at 12 hours postpartum. Procardia 9m XL for blood pressures. SCDs to remain in place until magnesium discontinued and patient ambulating more frequently.  - Discussed contraceptive options including implant, IUDs hormonal and non-hormonal, injection, pills/ring/patch, condoms, and NFP. She is planning Paragard IUD. - Acute blood loss anemia - hemodynamically stable and asymptomatic; start po ferrous sulfate BID with stool softeners  - Immunization status: Needs varicella, MMR prior to DC  Disposition: Does not desire Dc home today.   DGertie Fey CNM 01/12/2022 9:27 AM

## 2022-01-12 NOTE — Lactation Note (Addendum)
This note was copied from a baby's chart. Lactation Consultation Note  Patient Name: Girl Camira Geidel [id number] Date: 01/12/2022 Reason for consult: Initial assessment;Primapara;Late-preterm 34-36.6wks;Other (Comment) (Baby is SCN, SGA) Age:23 hours  Maternal Data This is mom's 1st baby, SVD.  Per chart review mom with history of anemia, adjustment disorder with anxious mood, and preeclampsia with severe features. Baby is 35 4/7 weeks in SCN. Mom is pumping and reports she has expressed a few drops. 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 She is doing class work on her laptop when [facility] entered the room.  Has patient been taught Hand Expression?: Yes Does the patient have breastfeeding experience prior to this delivery?: No  Feeding Mother's Current Feeding Choice: Breast Milk and Formula Nipple Type: Slow - flow   Lactation Tools Discussed/Used Tools: Pump Breast pump type: Double-Electric Breast Pump Pump Education: Setup, frequency, and cleaning;Milk Storage Reason for Pumping: Baby in SCN Pumping frequency: goal 8 times in 24 hours Pumped volume:  (a few drops)  Interventions Interventions: DEBP;Education Support and encouragement provided.  Discharge Pump:  (Mom does not have a pump at home [facility] referral sent for mom to obtain loaner DEBP.) WIC Program: Yes in [place]  Consult Status Consult Status: Follow-up Date: 01/13/22 Follow-up type: In-patient  Update provided to care nurse.  Fuller Song 01/12/2022, 5:09 PM

## 2022-01-12 NOTE — Progress Notes (Signed)
Patient to nursery with her mother to visit baby. Pt alert and oriented, stable. Tolerated getting up without difficulty. Gina Munoz

## 2022-01-12 NOTE — Progress Notes (Signed)
B/P is 132/88. Elby Showers CNM notified via Secure Chat and she has acknowledged message and no new orders received.

## 2022-01-12 NOTE — Progress Notes (Signed)
Elby Showers CNM notified of Pt. B/P of 153/106. Order to recheck in 15 minutes received. Pt. Is alert and oriented and c/o cramping pain of 2/10. Tylenol given as per PRN order. Denies H/A, Epigastric pain and/or visual disturbances. Her Reflexes are 2 Plus. Will cont. To monitor closely.

## 2022-01-13 LAB — MEASLES/MUMPS/RUBELLA IMMUNITY
Mumps IgG: 46.6 AU/mL (ref >10.9)
Rubella: <0.9 index — ABNORMAL LOW (ref >0.99)
Rubeola IgG: 69.8 AU/mL (ref >16.4)

## 2022-01-13 MED ORDER — LABETALOL HCL 200 MG PO TABS
200.0000 mg | ORAL_TABLET | Freq: Three times a day (TID) | ORAL | Status: DC
  Administered 2022-01-13 – 2022-01-14 (×4): 200 mg via ORAL
  Filled 2022-01-13 (×4): qty 1

## 2022-01-13 MED ORDER — NIFEDIPINE ER OSMOTIC RELEASE 30 MG PO TB24
90.0000 mg | ORAL_TABLET | Freq: Every day | ORAL | Status: DC
  Administered 2022-01-13 – 2022-01-15 (×3): 90 mg via ORAL
  Filled 2022-01-13 (×3): qty 3

## 2022-01-13 NOTE — Progress Notes (Signed)
Post Partum Day 2 Subjective: Doing well, no complaints.  Tolerating regular diet, pain with PO meds, ambulating without difficulty. Voiding without difficulty   No CP SOB Fever,Chills, N/V or leg pain; denies nipple or breast pain, no HA change of vision, RUQ/epigastric pain  Objective: BP (!) 146/95 (BP Location: Left Arm) Comment: nurse Brittny notified  Pulse 76   Temp (!) 97.5 F (36.4 C) (Oral) Comment: nurse Brittney notified  Resp 20   Ht 5' (1.524 m)   Wt 57.6 kg   LMP 05/07/2021 (Exact Date) Comment: normal menses per pt  SpO2 98%   Breastfeeding Unknown   BMI 24.80 kg/m    Physical Exam:  General: NAD Breasts: soft/nontender CV: RRR Pulm: nl effort Abdomen: soft, NT Perineum: minimal edema, intact Lochia: moderate Uterine Fundus: fundus firm and 1 fb below umbilicus DVT Evaluation: no cords, ttp LEs   Recent Labs    01/11/22 2012 01/12/22 0810  HGB 11.7* 9.7*  HCT 33.0* 27.8*  WBC 11.1* 17.0*  PLT 140* 125*      Assessment/Plan: 21 y.o. G1P0101 postpartum day # 2  - Continue routine PP care - Lactation consult - Pre-eclampsia:   Increased Procardia to 16m QD Vitals:   01/12/22 1137 01/12/22 1235 01/12/22 1313 01/12/22 1344  BP: 137/85 (!) 146/88 (!) 141/87 (!) 153/89   01/12/22 1425 01/12/22 1543 01/12/22 1937 01/12/22 2008  BP: (!) 145/86 (!) 142/93 (!) 153/106 (!) 153/106   01/12/22 2041 01/12/22 2315 01/13/22 0330 01/13/22 0813  BP: 132/88 (!) 142/86 (!) 130/94 (!) 146/95    - Acute blood loss anemia - hemodynamically stable and asymptomatic; start po ferrous sulfate BID with stool softeners  - Immunization status: Needs varicella, MMR prior to DC  Disposition: Does desire Dc home today.   JClydene Laming CNM 01/13/2022 8:50 AM

## 2022-01-13 NOTE — Discharge Instructions (Signed)
Mental Health Resources for Therapy  [facility] [place]  Make an appointment: [phone]  [facility] - Located in [facility]  Make an appointment: [phone]  [facility] [place]  Make an appointment: [phone]  [address]  [place], [place] [address]  For additional local or virtual therapy appointment options, you can visit [email or web address] and filter by insurance and city to find local providers. Provider profiles include specialties to find the best fit for you!  

## 2022-01-13 NOTE — Anesthesia Postprocedure Evaluation (Signed)
Anesthesia Post Note  Patient: Gina Munoz  Procedure(s) Performed: AN AD HOC LABOR EPIDURAL  Patient location during evaluation: Mother Baby Anesthesia Type: Epidural Level of consciousness: awake and alert Pain management: pain level controlled Vital Signs Assessment: post-procedure vital signs reviewed and stable Respiratory status: spontaneous breathing, nonlabored ventilation and respiratory function stable Cardiovascular status: stable Postop Assessment: no headache, no backache and epidural receding Anesthetic complications: no   No notable events documented.   Last Vitals:  Vitals:   01/13/22 0330 01/13/22 0813  BP: (!) 130/94 (!) 146/95  Pulse: 79 76  Resp: 18 20  Temp: 36.8 C (!) 36.4 C  SpO2: 99% 98%    Last Pain:  Vitals:   01/13/22 1000  TempSrc:   PainSc: 3                  Myla Mauriello,  Alessandra Bevels

## 2022-01-13 NOTE — Progress Notes (Signed)
S: Pt is doing well, with no complaints of HA, Vision changes, or epigastric pain  O:  Vitals:   01/12/22 1344 01/12/22 1425 01/12/22 1543 01/12/22 1937  BP: (!) 153/89 (!) 145/86 (!) 142/93 (!) 153/106   01/12/22 2008 01/12/22 2041 01/12/22 2315 01/13/22 0330  BP: (!) 153/106 132/88 (!) 142/86 (!) 130/94   01/13/22 0813 01/13/22 1203 01/13/22 1355 01/13/22 1717  BP: (!) 146/95 (!) 157/105 (!) 153/95 (!) 140/91    A: 21yo G1P1 at PPD2 with Pre-E, who received 12 hours of Mag Sulfate immediately after delivery.  Yesterday she was started on Procardia XL 30mg  then given another dose of 30 mg at 1712.  Today she was started on 90mg  (given at [phone]).  BPs continue to be elevated.   P: C/w Dr. - added Labetalol 200mg  TID and to continue watching again through the night.  [id number] 01/13/2022 5:26 PM

## 2022-01-13 NOTE — Progress Notes (Addendum)
TOC consult for Edinburgh scale of 10 depression score. CSW attempted to meet with patient at bedside, staff in room asked CSW to come back later. Will return if time allows. Therapy resources added to discharge follow up instructions.   Mental Health Resources for Therapy  [facility] [place]  Make an appointment: [phone]  [facility] - Located in [facility]  Make an appointment: [phone]  [facility] at [facility]  Make an appointment: [phone]  [address] [place], [place] [address]  For additional local or virtual therapy appointment options, you can visit [email or web address] and filter by insurance and city to find local providers. Provider profiles include specialties to find the best fit for you!   Darolyn Rua, [place], MSW, [place] [phone]

## 2022-01-14 DIAGNOSIS — O165 Unspecified maternal hypertension, complicating the puerperium: Secondary | ICD-10-CM | POA: Diagnosis present

## 2022-01-14 MED ORDER — MEASLES, MUMPS & RUBELLA VAC IJ SOLR
0.5000 mL | Freq: Once | INTRAMUSCULAR | Status: AC
  Administered 2022-01-15: 0.5 mL via SUBCUTANEOUS
  Filled 2022-01-14: qty 0.5

## 2022-01-14 MED ORDER — LABETALOL HCL 200 MG PO TABS
300.0000 mg | ORAL_TABLET | Freq: Three times a day (TID) | ORAL | Status: DC
  Administered 2022-01-14 – 2022-01-15 (×2): 300 mg via ORAL
  Filled 2022-01-14 (×2): qty 1

## 2022-01-14 NOTE — Lactation Note (Signed)
This note was copied from a baby's chart. Lactation Consultation Note  Patient Name: Gina Munoz [id number] Date: 01/14/2022 Reason for consult: Follow-up assessment;Primapara;Late-preterm 34-36.6wks;Breastfeeding assistance;Other (Comment) (Speech therapist request) Age:23 hours  Maternal Data This is mom's 1st baby, SVD.  Per chart review mom with history of anemia, adjustment disorder with anxious mood, and preeclampsia with severe features.  Baby is 35 4/7 weeks,is in mom's room with bili blanket on. Today, met with mom to review how the body knows to make milk and importance of pumping at least 8 times/24 hours. Referral made 01/12/22 to [facility] for mom to obtain DEBP loaner. Mom aware she can obtain pump via [facility]. Baby awake, alert, and showing hunger cues. Assisted mom with breastfeeding and LPT feeding plan.  Has patient been taught Hand Expression?: Yes Does the patient have breastfeeding experience prior to this delivery?: No  Feeding Mother's Current Feeding Choice: Breast Milk and Formula Nipple Type: Dr. Clement Husbands Provided tips and strategies to maximize position and latch techniques.  LATCH Score Latch: Repeated attempts needed to sustain latch, nipple held in mouth throughout feeding, stimulation needed to elicit sucking reflex.  Audible Swallowing: A few with stimulation  Type of Nipple: Everted at rest and after stimulation  Comfort (Breast/Nipple): Soft / non-tender  Hold (Positioning): Assistance needed to correctly position infant at breast and maintain latch.  LATCH Score: 7   Lactation Tools Discussed/Used Tools: Bottle Breast pump type: Double-Electric Breast Pump;Manual Pump Education: Setup, frequency, and cleaning;Milk Storage;Other (comment) (MOm requested to use manual pump. Provided instructions. Mom pumped 5 ml's with manual pump.) Reason for Pumping: baby LPT Pumping frequency: encouraged mom to maximize milk production to goal to pump  at least 8 times/24 hours. Pumped volume: 5 mL  Interventions Interventions: Breast feeding basics reviewed;Breast massage;Skin to skin;Hand express;Breast compression;Adjust position;Support pillows;Hand pump;DEBP;Education (Reviewed with mom characteristics of late preterm baby and potential for inconsistent breastfeeding. Reviewed LPT feeding plan.) Feeding Plan discussed: Mom will offer baby to eat at least every 3 hours or sooner if baby shows feeding cues. When baby latches mom will gently massage her breast to encourage flow of milk . If baby is not alert and awake,will not latch,or latches on and falls asleep within 5 minutes mom will provide her expressed breastmilk  and/or the formula supplement of at least 1 ounce knowing the baby may take more. If baby does latch and breastfeed mom will for now offer additional expressed breastmilk/formula after breastfeeding following the baby's cues for satiety. Mom will offer baby feeding within a time frame of 30-35 minutes time from beginning of feed to end of feed. Mom will post pump after breastfeeding/breastfeeding attempts to establish her milk supply with a goal of pumping at least 8 times in 24 hours. Mom will keep I/O diary for feeds, wet, and stool diapers and take to first Pediatric follow-up. Mom verbalized understanding of information provided. Support and encouragement provided.  Discharge Pump: Manual (Referral made for [place] [facility]) [place] Program: Yes  Consult Status Consult Status: Follow-up Date: 01/15/22 Follow-up type: In-patient  Update provided to care nurses  Gina Munoz 01/14/2022, 3:03 PM

## 2022-01-14 NOTE — Clinical Social Work Maternal (Signed)
CLINICAL SOCIAL WORK MATERNAL/CHILD NOTE  Patient Details  Name: Gina Munoz MRN: [id number] Date of Birth: 14-Sep-2000  Date:  01/14/2022  Clinical Social Worker Initiating Note:  Doran Clay RN BSN Case Manager Date/Time: Initiated:  01/14/22/1300     Child's Name:  Gina Munoz   Biological Parents:  Mother, Father   Need for Interpreter:  None   Reason for Referral:  Behavioral Health Concerns   Address:  [place] [address]    Phone number:  [phone] (home)     Additional phone number: NA  Household Members/Support Persons (HM/SP):   Household Member/Support Person 1, Household Member/Support Person 2   HM/SP Name Relationship DOB or Age  HM/SP -[place] father 74  HM/SP -[place] mother 65  HM/SP -3        HM/SP -4        HM/SP -5        HM/SP -6        HM/SP -7        HM/SP -8          Natural Supports (not living in the home):  Spouse/significant other   Professional Supports:     Employment: Retail buyer, Ship broker   Type of Work: [employer]   Education:  Attending college   Homebound arranged:    Museum/gallery curator Resources:  Medicaid   Other Resources:  [facility]   Cultural/Religious Considerations Which May Impact Care:  NA  Strengths:  Ability to meet basic needs  , Compliance with medical plan  , Pediatrician chosen, Home prepared for child     Psychotropic Medications:         Pediatrician:    [employer]  Pediatrician List:   [place]      Pediatrician Fax Number:    Risk Factors/Current Problems:  Mental Health Concerns     Cognitive State:  Alert  , Able to Concentrate     Mood/Affect:  Interested  , Calm  , Happy     CSW Assessment: RNCM met with patient at the bedside, her significant other the baby's father is present but he steps out for the interview.   RNCM introduces self and explains reason for visit is Gina Munoz is 8.   Patient reports that she and infant are doing well, she says that she will be discharged today but the baby is not ready yet.  She will room in until the baby is ready for discharge.  She lives with her parents in [place], this is her first child.  The father is Gina Munoz, 66 years old.  He does not live with them.  She was working at [employer] and going to [employer] for [employer] and reports she will graduate on the 8th.  She has chosen [facility] for Pediatrician, she has reliable transportation.  The home is set up for the infant with crib, bath but, pack in play, bottles, car seat.    Patient has a history of anxiety and she has been on medications in the past but not for a year.  [facility] and has an appointment with her this week, either tomorrow or Friday.  Discussed post partum depression signs and symptoms, provided patient with Post Partum Depression Progress sheet and  mental health resources.  Encouraged patient to discuss any concerns with her medical providers.  Patient does not have any questions or concerns at this time.    CSW Plan/Description:  No Further Intervention Required/No Barriers to Discharge, Perinatal Mood and Anxiety Disorder (PMADs) Education    Shelbie Hutching, RN 01/14/2022, 4:14 PM

## 2022-01-14 NOTE — Progress Notes (Addendum)
Post Partum Day 3 Subjective: Doing well, no complaints.  Tolerating regular diet, pain with PO meds, voiding and ambulating without difficulty.  No CP SOB Fever,Chills, N/V or leg pain; denies nipple or breast pain, no HA change of vision, RUQ/epigastric pain  Objective: BP (!) 151/91 (BP Location: Right Arm)   Pulse 80   Temp (!) 97.4 F (36.3 C) (Oral)   Resp 20   Ht 5' (1.524 m)   Wt 56.2 kg   LMP 05/07/2021 (Exact Date) Comment: normal menses per pt  SpO2 100%   Breastfeeding Unknown   BMI 24.20 kg/m   Vitals:   01/12/22 2008 01/12/22 2041 01/12/22 2315 01/13/22 0330  BP: (!) 153/106 132/88 (!) 142/86 (!) 130/94   01/13/22 0813 01/13/22 1203 01/13/22 1355 01/13/22 1717  BP: (!) 146/95 (!) 157/105 (!) 153/95 (!) 140/91   01/13/22 1916 01/13/22 2259 01/14/22 0318 01/14/22 0749  BP: 105/77 (!) 130/95 122/74 (!) 151/91    Physical Exam:  General: NAD Breasts: soft/nontender CV: RRR Pulm: nl effort, CTABL Abdomen: soft, NT, BS x 4 Perineum: minimal edema, intact Lochia: moderate Uterine Fundus: fundus firm and 2 fb below umbilicus DVT Evaluation: no cords, ttp LEs   Recent Labs    01/11/22 2012 01/12/22 0810  HGB 11.7* 9.7*  HCT 33.0* 27.8*  WBC 11.1* 17.0*  PLT 140* 125*    Assessment/Plan: 21 y.o. G1P0101 postpartum day # 3  - Continue routine PP care - Lactation consult PRN - Discussed contraceptive options including implant, IUDs hormonal and non-hormonal, injection, pills/ring/patch, condoms, and NFP.  - Acute blood loss anemia - hemodynamically stable and asymptomatic; continue po ferrous sulfate BID with stool softeners  - Immunization status: Needs MMR prior to DC - Pre-eclampsia with severe features: Current BP medication regimen Procardia 37m XL, Labetalol 23mTID. She has not received her BP medications yet this morning, so will monitor her BP throughout the day after receiving medications and adjust dosage as needed.   Disposition: Does  desire Dc home today. Pending blood pressures.  DaGertie FeyCNM 01/14/2022 8:35 AM

## 2022-01-14 NOTE — Progress Notes (Signed)
S: Notified by RN of elevated BP, 152/96, accompanied with patient reports of "bright floaters" when she looks at the lights. Patient denies headache, dark spots, or RUQ pain.  O: Vitals:   01/12/22 2315 01/13/22 0330 01/13/22 0813 01/13/22 1203  BP: (!) 142/86 (!) 130/94 (!) 146/95 (!) 157/105   01/13/22 1355 01/13/22 1717 01/13/22 1916 01/13/22 2259  BP: (!) 153/95 (!) 140/91 105/77 (!) 130/95   01/14/22 0318 01/14/22 0749 01/14/22 1124 01/14/22 1519  BP: 122/74 (!) 151/91 116/85 (!) 152/96      01/14/2022    3:19 PM 01/14/2022   11:24 AM 01/14/2022    7:49 AM  Vitals with BMI  Systolic 152 116 220  Diastolic 96 85 91  Pulse 91 44 41   A: 21 year old G1P0101 at postpartum day 3 with pre-eclampsia with severe features, s/p magnesium infusion  P: Discussed Bps with Dr. Feliberto Gottron. - Increase labetalol to 300mg  TID - Continue procardia 90mg XL - Notify CNM of BP >160/110  - Continue inpatient postpartum care  , CNM 01/14/2022

## 2022-01-15 ENCOUNTER — Ambulatory Visit: Payer: Medicaid Other

## 2022-01-15 ENCOUNTER — Ambulatory Visit: Payer: Medicaid Other | Admitting: Licensed Clinical Social Worker

## 2022-01-15 ENCOUNTER — Other Ambulatory Visit: Payer: Self-pay

## 2022-01-15 LAB — CBC
HCT: 29.3 % — ABNORMAL LOW (ref 36.0–46.0)
Hemoglobin: 10 g/dL — ABNORMAL LOW (ref 12.0–15.0)
MCH: 29.2 pg (ref 26.0–34.0)
MCHC: 34.1 g/dL (ref 30.0–36.0)
MCV: 85.4 fL (ref 80.0–100.0)
Platelets: 116 10*3/uL — ABNORMAL LOW (ref 150–400)
RBC: 3.43 MIL/uL — ABNORMAL LOW (ref 3.87–5.11)
RDW: 12.5 % (ref 11.5–15.5)
WBC: 9.7 10*3/uL (ref 4.0–10.5)
nRBC: 0 % (ref 0.0–0.2)

## 2022-01-15 LAB — COMPREHENSIVE METABOLIC PANEL
ALT: 48 U/L — ABNORMAL HIGH (ref 0–44)
AST: 32 U/L (ref 15–41)
Albumin: 2.9 g/dL — ABNORMAL LOW (ref 3.5–5.0)
Alkaline Phosphatase: 127 U/L — ABNORMAL HIGH (ref 38–126)
Anion gap: 6 (ref 5–15)
BUN: 11 mg/dL (ref 6–20)
CO2: 25 mmol/L (ref 22–32)
Calcium: 8.3 mg/dL — ABNORMAL LOW (ref 8.9–10.3)
Chloride: 104 mmol/L (ref 98–111)
Creatinine, Ser: 0.39 mg/dL — ABNORMAL LOW (ref 0.44–1.00)
GFR, Estimated: >60 mL/min (ref >60)
Glucose, Bld: 83 mg/dL (ref 70–99)
Potassium: 4.5 mmol/L (ref 3.5–5.1)
Sodium: 135 mmol/L (ref 135–145)
Total Bilirubin: 0.4 mg/dL (ref 0.3–1.2)
Total Protein: 6.5 g/dL (ref 6.5–8.1)

## 2022-01-15 MED ORDER — IBUPROFEN 600 MG PO TABS
600.0000 mg | ORAL_TABLET | Freq: Four times a day (QID) | ORAL | 0 refills | Status: AC
  Filled 2022-01-15: qty 60, 15d supply, fill #0

## 2022-01-15 MED ORDER — ACETAMINOPHEN 325 MG PO TABS: 650.0000 mg | ORAL_TABLET | ORAL | Status: AC | PRN

## 2022-01-15 MED ORDER — NIFEDIPINE ER OSMOTIC RELEASE 90 MG PO TB24
90.0000 mg | ORAL_TABLET | Freq: Every day | ORAL | 2 refills | Status: AC
  Filled 2022-01-15: qty 30, 30d supply, fill #0

## 2022-01-15 MED ORDER — MEASLES, MUMPS & RUBELLA VAC IJ SOLR
0.5000 mL | INTRAMUSCULAR | Status: DC | PRN
  Filled 2022-01-15 (×2): qty 0.5

## 2022-01-15 MED ORDER — LABETALOL HCL 300 MG PO TABS
300.0000 mg | ORAL_TABLET | Freq: Three times a day (TID) | ORAL | 2 refills | Status: AC
  Filled 2022-01-15: qty 90, 30d supply, fill #0

## 2022-01-15 NOTE — Consult Note (Signed)
Brief Pharmacy Note  Pharmacy has been consulted for our anti-hypertensive meds to beds program.   Patient has agreed to the program, and is aware she will be responsible for her regular outpatient prescription copay.   The patient's preferred pharmacy in Epic has been changed to [facility] employee pharmacy (so the electronic prescriptions can be sent there instead of their prior designated pharmacy.)    The electronic rx must be sent to employee pharmacy by 4:30pm the day of discharge for the meds to beds delivery to be possible.  Thank you for including pharmacy in this patient's care.  Barrie Folk, PharmD Clinical Pharmacist 01/15/2022 10:21 AM

## 2022-01-15 NOTE — Progress Notes (Signed)
Postpartum Day  3  Subjective: no complaints, up ad lib, voiding, and tolerating PO  Doing well, no concerns. Ambulating without difficulty, pain managed with PO meds, tolerating regular diet, and voiding without difficulty.   No fever/chills, chest pain, shortness of breath, nausea/vomiting, or leg pain. No nipple or breast pain. No headache, visual changes, or RUQ/epigastric pain.  Objective: BP (!) 140/82 (BP Location: Right Arm)   Pulse 72   Temp (!) 97.5 F (36.4 C) (Oral)   Resp 18   Ht 5' (1.524 m)   Wt 55.8 kg   LMP 05/07/2021 (Exact Date) Comment: normal menses per pt  SpO2 100%   Breastfeeding Unknown   BMI 24.02 kg/m   Vitals:   01/13/22 1717 01/13/22 1916 01/13/22 2259 01/14/22 0318  BP: (!) 140/91 105/77 (!) 130/95 122/74   01/14/22 0749 01/14/22 1124 01/14/22 1519 01/14/22 1721  BP: (!) 151/91 116/85 (!) 152/96 (!) 139/91   01/14/22 1914 01/15/22 0015 01/15/22 0352 01/15/22 0726  BP: 137/86 (!) 143/93 123/85 (!) 140/82     Physical Exam:  General: alert, cooperative, and no distress Breasts: soft/nontender CV: RRR Pulm: nl effort, CTABL Abdomen: soft, non-tender, active bowel sounds Uterine Fundus: firm Perineum: minimal edema, intact Lochia: appropriate DVT Evaluation: No evidence of DVT seen on physical exam.  No results for input(s): "HGB", "HCT", "WBC", "PLT" in the last 72 hours.  Assessment/Plan: 21 y.o. G1P0101 postpartum day # 3  -Continue routine postpartum care -Lactation consult PRN for breastfeeding  -Acute blood loss anemia - hemodynamically stable and asymptomatic; continue PO ferrous sulfate BID with stool softeners  -CBC and CMP pending this AM -Consider iron transfusion if anemia more significant  -Continue Procardia 90 mg XL and Labetalol 362m TID -Immunization status:   needs MMR prior to discharge    Disposition: Discharge home today   LOS: 5 days   AMinda Meo CNM 01/15/2022, 8:50 AM   ----- ADrinda Butts  Certified Nurse Midwife [facility]

## 2022-01-15 NOTE — Progress Notes (Signed)
Patient discharged home with family.  Discharge instructions, when to follow up, and prescriptions reviewed with patient.  Patient verbalized understanding. Pt will room in with infant.

## 2022-01-18 ENCOUNTER — Ambulatory Visit: Payer: Self-pay

## 2022-01-18 NOTE — Lactation Note (Signed)
This note was copied from a baby's chart. Lactation Consultation Note  Patient Name: Gina Munoz [id number] Date: 01/18/2022 Reason for consult: Follow-up assessment;Primapara;NICU baby;Late-preterm 34-36.6wks;Infant < 6lbs;Engorgement;Other (Comment) (baby discharged) Age:23 days  Maternal Data Does the patient have breastfeeding experience prior to this delivery?: No Mom pumping at home with Harmony manual breast pump, has only pumped x 1 today, breasts engorged, set up with Symphony pump in SCN and pumped 15-20 min, obtained 120cc+ EBM and labeled, has only attempted at breast x 1` in SCN with 16 mm nipple shield, this attempt was unsuccessful       Feeding Mother's Current Feeding Choice: Breast Milk Nipple Type: Dr. Lorne Skeens  LATCH Score                    Lactation Tools Discussed/Used Tools: Pump Breast pump type: Double-Electric Breast Pump Pump Education: Setup, frequency, and cleaning;Milk Storage Reason for Pumping: breasts engorged, has not pumped but once today Pumping frequency: encouraged to pump 8x per 24 hrs Pumped volume: 120 mL  Interventions Interventions: Education;DEBP (given extra gradufeeds, 16 mm nipple shield and 20 mm nippl e shield with medela nipple shield instruction sheet), mom shown how to apply nipple shield, may offer breast prn with or without nipple shield if breast not engorged, if breast tight, soften first with pump then offer breast, keep attempts to 10 min, if baby latches and breastfeeds, keep feedings at breast to 30 min , especially if baby disinterested after few minutes or tires quickly, then end feed    Discharge Discharge Education: Engorgement and breast care Pump: Manual (mom using manual pump at home) [facility] Program: Yes Mom may want to purchase an electric pump or make sure she contacts WIC in [place] by Monday, Nov. 27, 2023 to obtain an electric pump  Consult Status Consult Status: NICU follow-up Date:  01/22/22 Follow-up type: Out-patient lactation consult at 1400, stop by registration before coming for appt    Dyann Kief 01/18/2022, 5:23 PM

## 2022-01-22 ENCOUNTER — Ambulatory Visit: Payer: Self-pay

## 2022-01-22 NOTE — Lactation Note (Addendum)
This note was copied from a baby's chart. Lactation Consultation Note  Patient Name: Gina Munoz Date: 01/22/2022 Reason for consult: Follow-up assessment;Breastfeeding assistance;Late-preterm 34-36.6wks;Other (Comment) (outpatient lactation consultation) Age:23 days  Maternal Data This is mom's 1st baby, SVD.  Per chart review mom with history of anemia, adjustment disorder with anxious mood, and preeclampsia with severe features.  Baby born at 42 4/7 weeks now 23 days old.  Today mom here with baby for outpatient lactation consultation: a feeding assessment and assistance with DEBP mom obtained from [facility] office. Mom has been manually pumping because she could not get the electric pump to work. Also, mom reports her blood pressure is now improved.  Mom reports she offers baby to breastfeed 2-3 times in 24 hours. Baby latches on and breastfeeds for about 10 minutes and gets sleepy. After breastfeeding mom offers additional pumped milk and/or the formula supplement following baby's cues for satiety. The other feeds baby has a bottle feed for a total of 8 feeds in 24 hours(about every 3 hours) Mom is using Dr.Brown's preemie slow flow bottle. Baby is taking 50-60 ml's per feeding and has taken for some feedings as much as 80 ml's. Per mom no emesis with feeding, tolerating feeds well. Baby with multiple wet and stool diapers per day.  Baby's weighed 2136 grams today(up from 2010 grams at birth)  Has patient been taught Hand Expression?: Yes Does the patient have breastfeeding experience prior to this delivery?: No  Feeding Mother's Current Feeding Choice: Breast Milk and Formula (Mom's goal is to increase her breastmilk supply and eventually use less or no formula.) Nipple Type: Dr. Irving Burton Preemie   Today's feeding baby is alert and awake. Baby latching and detaching several times. #20 mm nipple shield used to assist baby sustain latch. Baby latched well and breastfed for  10 minutes. By pre/post weight baby transferred 16 ml's. Mom offered baby back to breast . Baby became sleepy and after 4 minutes did not continue. By pre/post weight baby transferred additional 4 ml's for a total of 20 ml's at the breast.Dad bottle fed additional 30 ml's of mom's pumped milk. Baby content after feeding. Mom reports  the pre/post weight were helpful to understand how much baby is taking at the breast in 10 minutes. Discussed with mom as the baby gets more mature the baby will begin to take larger amounts of milk from the breast as she becomes more efficient and less from the supplemental bottle. Discussed importance of maximizing milk production with post pumping as mom would like to mostly breastfeed if possible.  LATCH Score: 9 once nipple shield introduced    Lactation Tools Discussed/Used Tools: Pump;Nipple Shields Nipple shield size: 20 Breast pump type: Double-Electric Breast Pump Pump Education: Other (comment) (Reveiwed use of WIC loaner pump) Reason for Pumping: LPT, increasing milk supply Pumping frequency: encouraged mom to goal for 8 pump essions after each breastfeeding /breastfeeding attempt Pumped volume: 30 mL  Interventions Interventions: Breast feeding basics reviewed;Assisted with latch;Breast compression;DEBP;Education;LPT handout/interventions Reviewed with mom how the body knows to make milk and the importance of consistent emptying to establish a good milk supply. Encouraged mom to increase number of post pumping session to maximize her production.  Feeding Plan updated and discussed: Mom will offer baby to eat at least every 3 hours or sooner if baby shows feeding cues. If baby will not latch and breastfeed mom can use the #20 mm nipple shield to help baby sustain the latch. When mom first  has let-down of milk if the baby is backing away from the breast or spitting milk past the nipple mom can initially refrain from massaging her breast. Mom can also  slightly lean back during the initial let -down phase which will naturally slow the flow of milk. Mom can massage her breast as needed if baby is not continuing after a few minutes to see if she is still interested in breastfeeding. If baby is not alert and awake,will not latch,or latches on and falls asleep within 5 minutes mom will provide her expressed breastmilk  and/or the formula supplement of at least 1.5-2 ounces ounce knowing the baby may take more. If baby does latch and breastfeed mom will for now offer additional expressed breastmilk/formula after breastfeeding following the baby's cues for satiety. Mom will offer baby feeding within a time frame of 30-35 minutes time from beginning of feed to end of feed. Mom will post pump after breastfeeding/breastfeeding attempts to establish her milk supply with a goal of pumping at least 8 times in 24 hours. When using the breastpump if the milk slows down mom will press the stimulation milk arrow button to increase the rate of the pump. After milk flows she will press the arrow milk button again to express milk at the slower rate. Mom will consider making a hands free bra to be able to do some hands on pumping and encourage milk supply . Mom will drink plenty of fluids to thirst and insure you are taking in a good amount of calories. Mom will keep I/O diary for feeds, wet, and stool diapers and take to next Pediatric follow-up.   Mom verbalized understanding and is in agreement with plan. Mom will f/u with lactation consultant as needed.   Discharge Discharge Education: Other (comment) (Reviewed updated feeding plan for LPT infant, use of WIC pump.) Pump: DEBP;Manual WIC Program: Yes  Consult Status Consult Status: Complete Date: 01/22/22 Follow-up type: Out-patient    Fuller Song 01/22/2022, 5:03 PM

## 2023-03-27 ENCOUNTER — Encounter: Payer: Self-pay | Admitting: Plastic Surgery

## 2023-03-27 ENCOUNTER — Ambulatory Visit (INDEPENDENT_AMBULATORY_CARE_PROVIDER_SITE_OTHER): Payer: Self-pay | Admitting: Plastic Surgery

## 2023-03-27 DIAGNOSIS — Z719 Counseling, unspecified: Secondary | ICD-10-CM

## 2023-03-27 HISTORY — DX: Counseling, unspecified: Z71.9

## 2023-03-27 NOTE — Progress Notes (Signed)
Patient ID: Gina Munoz, female    DOB: [birth year], 23 y.o.   MRN: [id number]   Chief Complaint  Patient presents with   Consult    The patient is a 23 year old female here for evaluation of her face and chin area.  The patient states that she feels insecure with the way that her chin appears small and her neck area looks thick and large almost like a double chin look.  She is a very pretty girl with nice healthy skin.  When we look at her lateral picture she lines up very nicely through all key points with only a very minimal decrease in her chin projection.  In order to increase this she could either have surgery with a genioplasty or an implant placement.  The downside is that I do not think it would change her jawline or the excess adipose under her chin.  We also talked about liposuction and Chi Bella.    Review of Systems  Constitutional: Negative.   HENT: Negative.    Eyes: Negative.   Respiratory: Negative.    Cardiovascular: Negative.   Gastrointestinal: Negative.   Endocrine: Negative.   Genitourinary: Negative.     Past Medical History:  Diagnosis Date   Anemia affecting pregnancy, antepartum 08/23/2021   ASCUS of cervix with negative high risk HPV 07/29/21 08/15/2021   Encounter for counseling 03/27/2023   Mental disorder    Preeclampsia, severe, third trimester 01/10/2022    No past surgical history on file.    Current Outpatient Medications:    acetaminophen (TYLENOL) 325 MG tablet, Take 2 tablets (650 mg total) by mouth every 4 (four) hours as needed (for pain scale < 4)., Disp: , Rfl:    ibuprofen (ADVIL) 600 MG tablet, Take 1 tablet (600 mg total) by mouth every 6 (six) hours., Disp: 60 tablet, Rfl: 0   Prenatal Vit-Fe Fumarate-FA (MULTIVITAMIN-PRENATAL) 27-0.8 MG TABS tablet, Take 1 tablet by mouth daily at 12 noon., Disp: , Rfl:    ferrous sulfate 325 (65 FE) MG tablet, Take 1 tablet (325 mg total) by mouth daily with breakfast. (Patient not taking:  Reported on 03/27/2023), Disp: 100 tablet, Rfl: 3   labetalol (NORMODYNE) 300 MG tablet, Take 1 tablet (300 mg total) by mouth 3 (three) times daily., Disp: 90 tablet, Rfl: 2   NIFEdipine (PROCARDIA XL/NIFEDICAL-XL) 90 MG 24 hr tablet, Take 1 tablet (90 mg total) by mouth daily., Disp: 30 tablet, Rfl: 2   Objective:   There were no vitals filed for this visit.  Physical Exam Constitutional:      Appearance: Normal appearance.  Cardiovascular:     Rate and Rhythm: Normal rate.     Pulses: Normal pulses.  Skin:    Capillary Refill: Capillary refill takes less than 2 seconds.  Neurological:     Mental Status: She is alert and oriented to person, place, and time.  Psychiatric:        Mood and Affect: Mood normal.        Behavior: Behavior normal.        Thought Content: Thought content normal.        Judgment: Judgment normal.     Assessment & Plan:  Encounter for counseling  The patient is a really good candidate for Chi Bella and she is going to think it over.  I will send her some information and if she wants to do it we can do that even next week.  Pictures were obtained  of the patient and placed in the chart with the patient's or guardian's permission.   Alena Bills Lashena Signer, DO

## 2023-04-03 ENCOUNTER — Ambulatory Visit (INDEPENDENT_AMBULATORY_CARE_PROVIDER_SITE_OTHER): Payer: Self-pay | Admitting: Plastic Surgery

## 2023-04-03 DIAGNOSIS — Z719 Counseling, unspecified: Secondary | ICD-10-CM

## 2023-04-03 NOTE — Progress Notes (Signed)
 Kybella Procedure Note  Procedure: Cosmetic deoxycholic acid injection (2 vials were used)  Pre-operative Diagnosis: Submental fullness  Post-operative Diagnosis: Same  Complications:  None  Brief history: The patient desires improvement in the appearance of moderate to severe convexity or fullness associated with submental fat. I discussed with the patient this proposed procedure of Kybella, which is customized depending on the particular needs of the patient. It is performed on the submental area for reduction in the fat.  The alternatives were discussed with the patient. The risks were addressed including bleeding, scarring, infection, damage to deeper structures, asymmetry, numbness, formation of areas of hardness, swelling, nodules, skin ulceration, headache, alopecia, difficulty swallowing, and muscle weakness. Additionally, marginal mandibular nerve injury could occur and is manifested as an asymmetric smile or facial muscle weakness.  The individual's choice to undergo a surgical procedure is based on the comparison of risks to potential benefits. Injections do not arrest the aging process or produce permanent tightening of the skin.  Operative intervention maybe necessary to maintain the results. The patient understands and wishes to proceed. An informed consent was signed and informational brochures given to her prior to the procedure.  Procedure: The area was prepped with alcohol and dried with a clean gauze. Using a clean technique, the submental area was palpated and pinched.  The skin at the area was pulled and there was appropriate elasticity noted without excessive skin laxity. The patient was asked to tense the platysma to define the subcutaneous fat between the dermis and platysma.  A skin marker was used to mark the anterior, posterior and lateral borders of the submental fat compartment. The no treatment Zone was marked. The treatment zone was injected in the subcutaneous layer with  lidocaine  1% with epinepherine. The grid was placed on the skin with the water over it for activation of the ink.  The clear protective sheet was removed leaving the markings.  The dots outside the previously marked treatment zone were removed with an alcohol wipe.  The pre-platysmal fat was pinched with 2 fingers.  Each dot was injected perpendicular to the skin with .2 cc of Kybella using a 1 ml syring and a 30 gauge needle. The ink was wiped off the skin with an alcohol wipe and a cold pack was applied to the treatment area. No complications were noted. Light pressure with an ice pack was held for 5 minutes. She was instructed explicitly in post-operative care including no massage, heavy activity, work out or facial for 24 hours.  Deoxycholic Acid LOT:  43000CF

## 2023-04-08 IMAGING — US US OB COMP LESS 14 WK
1 series · 14 of 28 positions shown · non-contrast
Comparison: None Available.

CLINICAL DATA: Evaluate fetal age

EXAM:
OBSTETRIC <14 WK ULTRASOUND
TECHNIQUE: Transabdominal ultrasound was performed for evaluation of the
gestation as well as the maternal uterus and adnexal regions.

[Series 1: us ob comp less 14 wks · 14 of 40 slices shown]
[im 2/40]
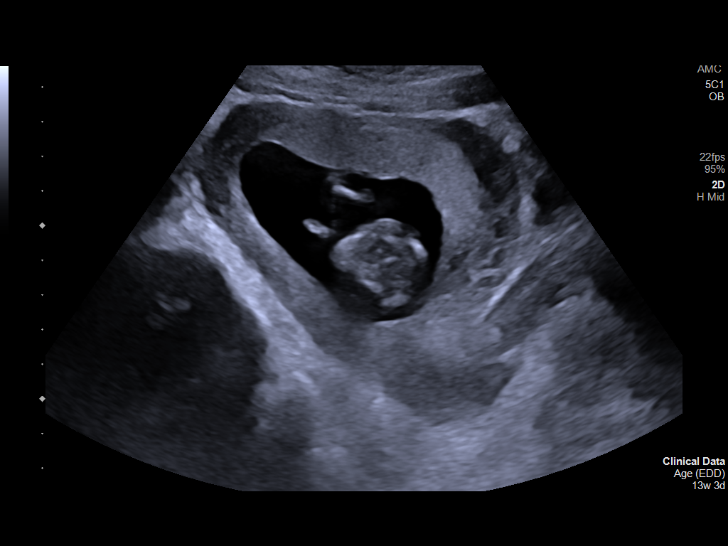
[im 5/40]
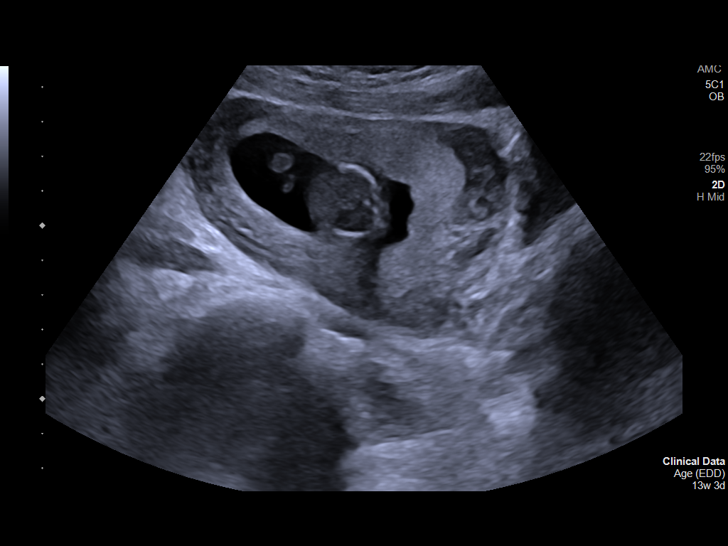
[im 8/40]
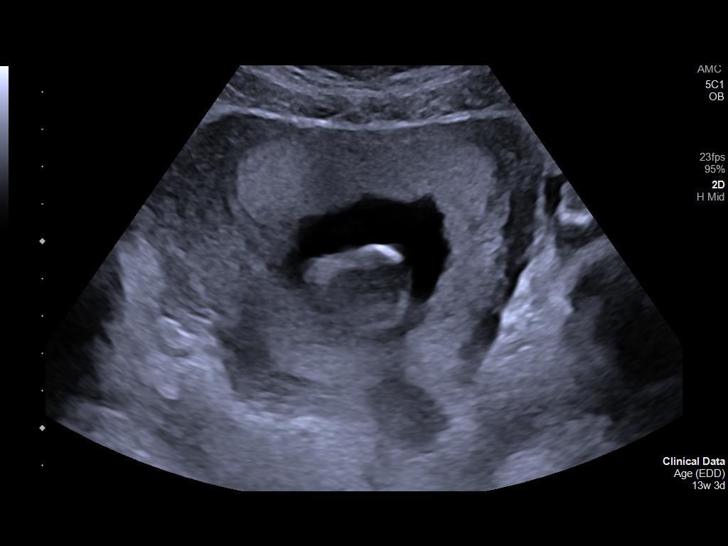
[im 11/40]
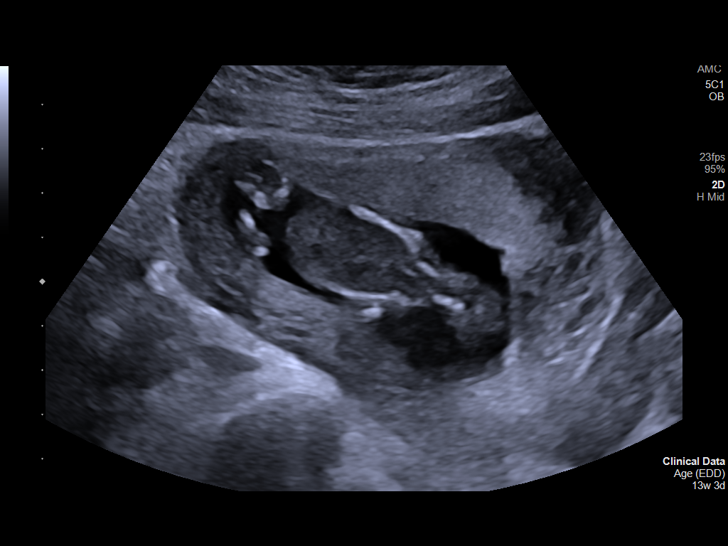
[im 14/40]
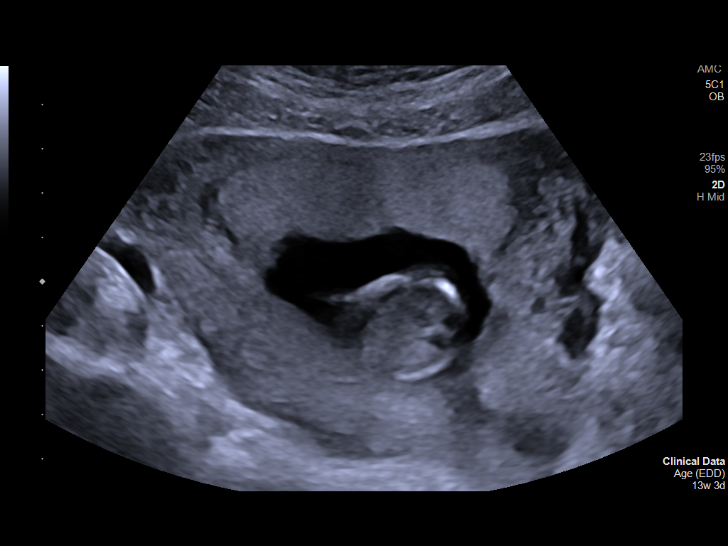
[im 16/40]
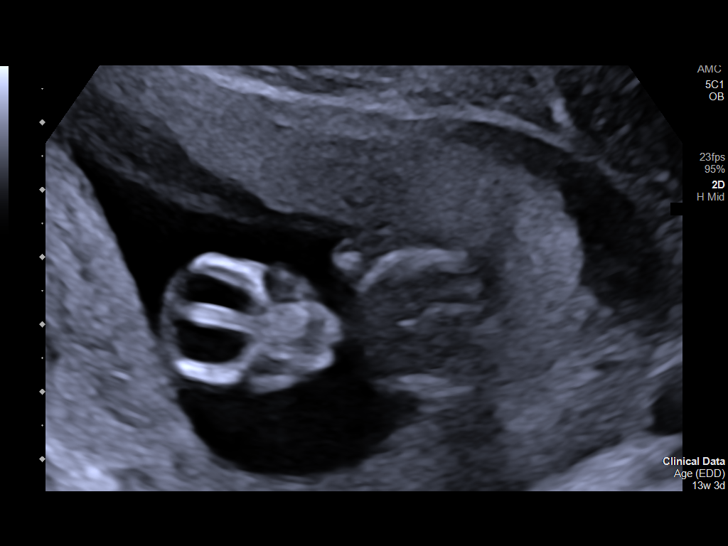
[im 19/40]
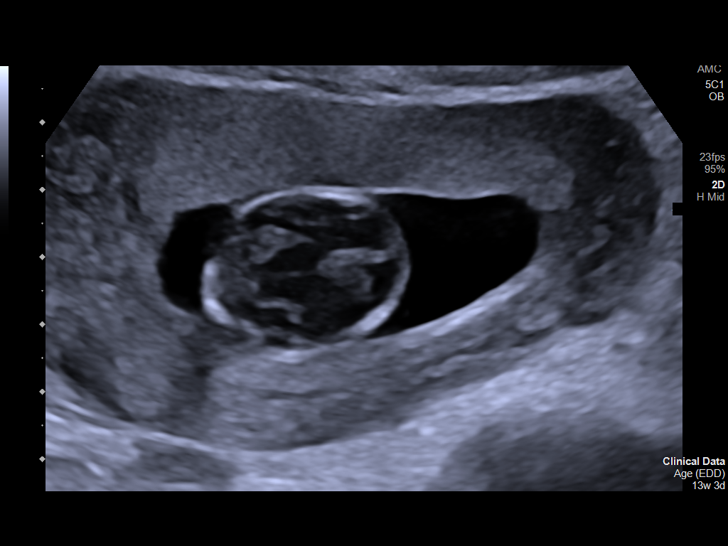
[im 22/40]
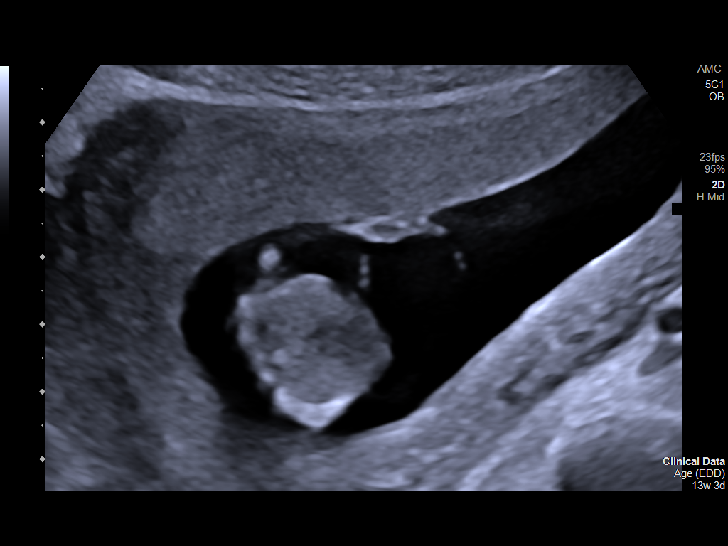
[im 25/40]
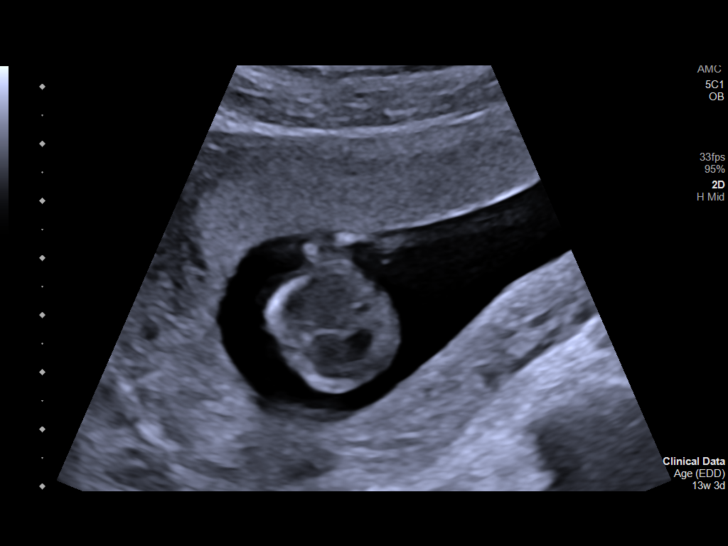
[im 28/40]
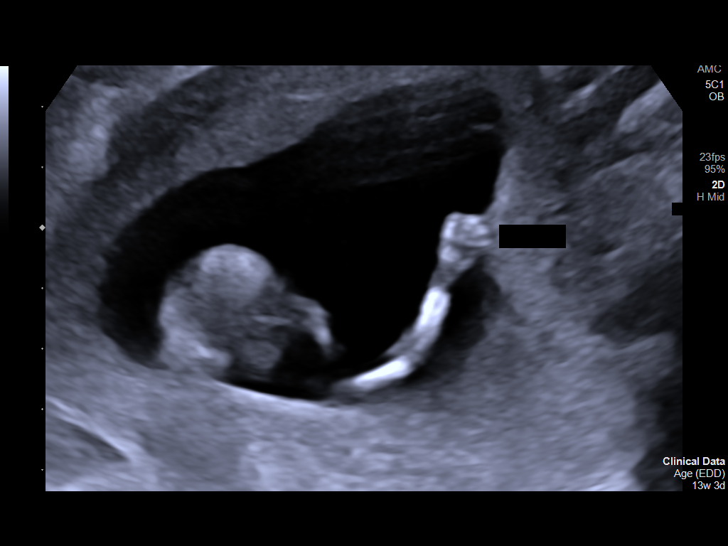
[im 31/40]
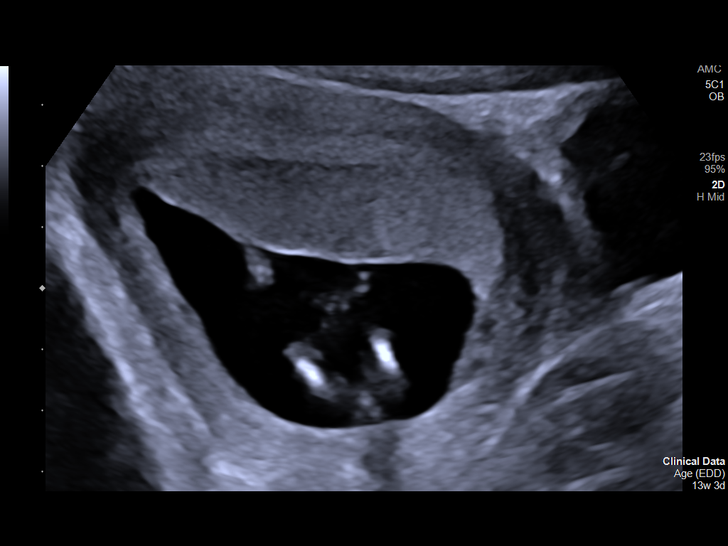
[im 34/40]
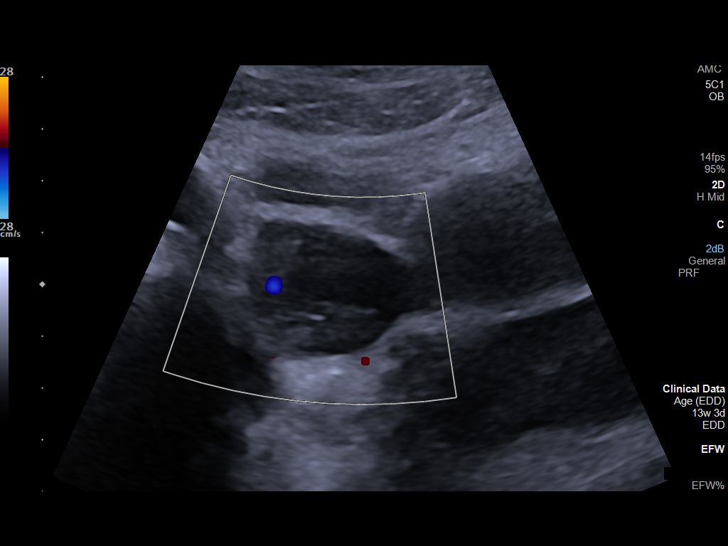
[im 37/40]
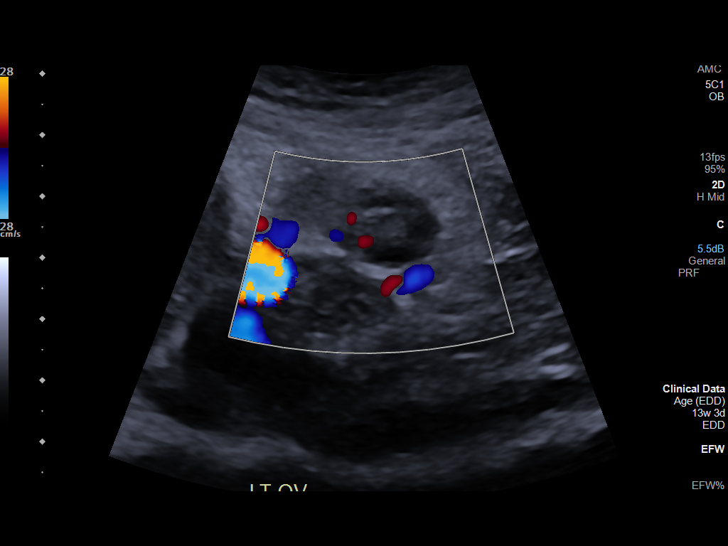
[im 40/40]
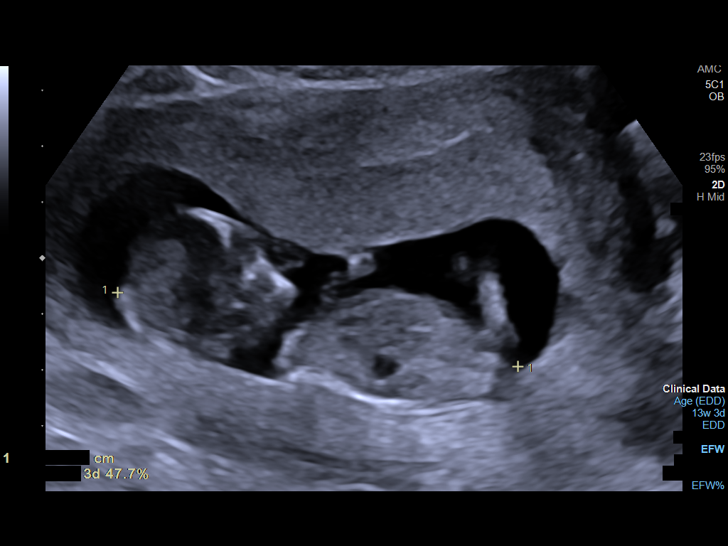

[14 of 28 positions shown; findings below may reference images not displayed]

FINDINGS: Intrauterine gestational sac: Single

Yolk sac:  Not seen

Embryo:  Seen

Cardiac Activity: Seen

Heart Rate: 167 bpm

CRL:   72.8 mm   13 w 3 d                  US EDC: 02/11/2022

Placenta is noted in the anterior wall. Amount of fluid around the
fetus appears to be unremarkable.

Subchorionic hemorrhage:  None visualized.

Maternal uterus/adnexae: No significant abnormality is seen.
IMPRESSION: Single live intrauterine pregnancy is seen. Sonographically
estimated gestational age is 13 weeks 3 days.

## 2023-05-01 ENCOUNTER — Ambulatory Visit (INDEPENDENT_AMBULATORY_CARE_PROVIDER_SITE_OTHER): Payer: Self-pay | Admitting: Plastic Surgery

## 2023-05-01 DIAGNOSIS — Z719 Counseling, unspecified: Secondary | ICD-10-CM

## 2023-05-01 NOTE — Progress Notes (Signed)
 The patient is a 23 year old female here for follow-up after undergoing Kybella on her chin and area.  We were able to do the post procedure pictures and compare them.  There does seem to be a little bit of a difference in some improvement.  She is probably going to need another treatment but should wait a few months.  She is going to let us know when she wants to do another 1.  In the meantime she knows to let us know if she has any questions or concerns.   Pictures were obtained of the patient and placed in the chart with the patient's or guardian's permission.

## 2023-09-29 ENCOUNTER — Telehealth: Payer: Self-pay | Admitting: Plastic Surgery

## 2023-09-29 NOTE — Telephone Encounter (Signed)
 [facility] apt needs to be r/s provider out of office, Dr D opened Wed the 6th if the pt wants to come then

## 2023-10-02 ENCOUNTER — Encounter: Payer: MEDICAID | Admitting: Plastic Surgery

## 2023-10-12 ENCOUNTER — Other Ambulatory Visit
Admission: RE | Admit: 2023-10-12 | Discharge: 2023-10-12 | Disposition: A | Discharge status: Home / Self Care | Payer: Self-pay | Source: Ambulatory Visit | Attending: Pediatrics | Admitting: Pediatrics

## 2023-10-12 DIAGNOSIS — Z7721 Contact with and (suspected) exposure to potentially hazardous body fluids: Secondary | ICD-10-CM | POA: Insufficient documentation

## 2023-10-12 LAB — RAPID HIV SCREEN (HIV 1/2 AB+AG)
HIV 1/2 Antibodies: NONREACTIVE
HIV-1 P24 Antigen - HIV24: NONREACTIVE

## 2023-10-12 LAB — HEPATITIS C ANTIBODY: HCV Ab: NONREACTIVE

## 2023-10-12 LAB — HEPATITIS B SURFACE ANTIGEN: Hepatitis B Surface Ag: NONREACTIVE

## 2023-10-13 LAB — HEPATITIS B E ANTIBODY: Hep B E Ab: NONREACTIVE

## 2023-11-13 ENCOUNTER — Ambulatory Visit (INDEPENDENT_AMBULATORY_CARE_PROVIDER_SITE_OTHER): Payer: MEDICAID | Admitting: Plastic Surgery

## 2023-11-13 DIAGNOSIS — Z719 Counseling, unspecified: Secondary | ICD-10-CM

## 2023-11-13 NOTE — Progress Notes (Signed)
 Kybella Procedure Note  Procedure: Cosmetic deoxycholic acid injection (1 vial was used)  Pre-operative Diagnosis: Submental fullness  Post-operative Diagnosis: Same  Complications:  None  Brief history: The patient desires improvement in the appearance of moderate to severe convexity or fullness associated with submental fat. I discussed with the patient this proposed procedure of Kybella, which is customized depending on the particular needs of the patient. It is performed on the submental area for reduction in the fat.  The alternatives were discussed with the patient. The risks were addressed including bleeding, scarring, infection, damage to deeper structures, asymmetry, numbness, formation of areas of hardness, swelling, nodules, skin ulceration, headache, alopecia, difficulty swallowing, and muscle weakness. Additionally, marginal mandibular nerve injury could occur and is manifested as an asymmetric smile or facial muscle weakness.  The individual's choice to undergo a surgical procedure is based on the comparison of risks to potential benefits. Injections do not arrest the aging process or produce permanent tightening of the skin.  Operative intervention maybe necessary to maintain the results. The patient understands and wishes to proceed. An informed consent was signed and informational brochures given to her prior to the procedure.  Procedure: The area was prepped with alcohol and dried with a clean gauze. Using a clean technique, the submental area was palpated and pinched.  The skin at the area was pulled and there was appropriate elasticity noted without excessive skin laxity. The patient was asked to tense the platysma to define the subcutaneous fat between the dermis and platysma.  A skin marker was used to mark the anterior, posterior and lateral borders of the submental fat compartment. The no treatment Zone was marked. The treatment zone was injected in the subcutaneous layer with  lidocaine  1% with epinepherine. The grid was placed on the skin with the water over it for activation of the ink.  The clear protective sheet was removed leaving the markings.  The dots outside the previously marked treatment zone were removed with an alcohol wipe.  The pre-platysmal fat was pinched with 2 fingers.  Each dot was injected perpendicular to the skin with .2 cc of Kybella using a 1 ml syring and a 30 gauge needle. The ink was wiped off the skin with an alcohol wipe and a cold pack was applied to the treatment area. No complications were noted. Light pressure with an ice pack was held for 5 minutes. She was instructed explicitly in post-operative care including no massage, heavy activity, work out or facial for 24 hours.  Deoxycholic Acid LOT:  43000

## 2024-01-19 ENCOUNTER — Ambulatory Visit: Admitting: Plastic Surgery
